# Patient Record
Sex: Female | Born: 1955 | Race: Black or African American | Hispanic: No | Marital: Married | Smoking: Never smoker
Health system: Southern US, Community
[De-identification: ages and names within clinical notes are randomized; demographics above are authoritative.]

## PROBLEM LIST (undated history)

## (undated) DIAGNOSIS — IMO0002 Reserved for concepts with insufficient information to code with codable children: Secondary | ICD-10-CM

## (undated) DIAGNOSIS — R112 Nausea with vomiting, unspecified: Secondary | ICD-10-CM

## (undated) DIAGNOSIS — Z789 Other specified health status: Secondary | ICD-10-CM

## (undated) DIAGNOSIS — Q796 Ehlers-Danlos syndrome, unspecified: Secondary | ICD-10-CM

## (undated) DIAGNOSIS — Z8709 Personal history of other diseases of the respiratory system: Secondary | ICD-10-CM

## (undated) DIAGNOSIS — I341 Nonrheumatic mitral (valve) prolapse: Secondary | ICD-10-CM

## (undated) DIAGNOSIS — M1990 Unspecified osteoarthritis, unspecified site: Secondary | ICD-10-CM

## (undated) DIAGNOSIS — Z9889 Other specified postprocedural states: Secondary | ICD-10-CM

## (undated) DIAGNOSIS — D649 Anemia, unspecified: Secondary | ICD-10-CM

## (undated) HISTORY — PX: DIAGNOSTIC LAPAROSCOPY: SUR761

## (undated) HISTORY — PX: ABDOMINAL HYSTERECTOMY: SHX81

## (undated) HISTORY — PX: DILATION AND CURETTAGE OF UTERUS: SHX78

## (undated) HISTORY — PX: TONSILLECTOMY: SUR1361

## (undated) HISTORY — PX: DENTAL RESTORATION/EXTRACTION WITH X-RAY: SHX5796

---

## 2012-08-09 ENCOUNTER — Encounter (HOSPITAL_COMMUNITY): Payer: Self-pay | Admitting: Pharmacy Technician

## 2012-08-17 ENCOUNTER — Other Ambulatory Visit: Payer: Self-pay | Admitting: Obstetrics & Gynecology

## 2012-08-17 ENCOUNTER — Other Ambulatory Visit: Payer: Self-pay

## 2012-08-17 ENCOUNTER — Encounter (HOSPITAL_COMMUNITY): Payer: Self-pay

## 2012-08-17 ENCOUNTER — Encounter (HOSPITAL_COMMUNITY)
Admission: RE | Admit: 2012-08-17 | Discharge: 2012-08-17 | Disposition: A | Discharge status: Home / Self Care | Payer: Medicaid Other | Source: Ambulatory Visit | Attending: Obstetrics & Gynecology | Admitting: Obstetrics & Gynecology

## 2012-08-17 HISTORY — DX: Other specified postprocedural states: R11.2

## 2012-08-17 HISTORY — DX: Reserved for concepts with insufficient information to code with codable children: IMO0002

## 2012-08-17 HISTORY — DX: Ehlers-Danlos syndrome, unspecified: Q79.60

## 2012-08-17 HISTORY — DX: Nonrheumatic mitral (valve) prolapse: I34.1

## 2012-08-17 HISTORY — DX: Unspecified osteoarthritis, unspecified site: M19.90

## 2012-08-17 HISTORY — DX: Other specified postprocedural states: Z98.890

## 2012-08-17 HISTORY — DX: Anemia, unspecified: D64.9

## 2012-08-17 LAB — COMPREHENSIVE METABOLIC PANEL
ALT: 11 U/L (ref 0–35)
Albumin: 4 g/dL (ref 3.5–5.2)
Alkaline Phosphatase: 91 U/L (ref 39–117)
Calcium: 10.5 mg/dL (ref 8.4–10.5)
GFR calc Af Amer: >90 mL/min (ref >90)
Potassium: 4.3 mEq/L (ref 3.5–5.1)
Sodium: 138 mEq/L (ref 135–145)
Total Protein: 7.6 g/dL (ref 6.0–8.3)

## 2012-08-17 LAB — URINALYSIS, ROUTINE W REFLEX MICROSCOPIC
Bilirubin Urine: NEGATIVE
Glucose, UA: 500 mg/dL — AB
Ketones, ur: NEGATIVE mg/dL
Nitrite: NEGATIVE
Specific Gravity, Urine: 1.025 (ref 1.005–1.030)
pH: 5.5 (ref 5.0–8.0)

## 2012-08-17 LAB — CBC
HCT: 43.9 % (ref 36.0–46.0)
Hemoglobin: 14.7 g/dL (ref 12.0–15.0)
MCH: 29.3 pg (ref 26.0–34.0)
MCHC: 33.5 g/dL (ref 30.0–36.0)
MCV: 87.6 fL (ref 78.0–100.0)
Platelets: 253 10*3/uL (ref 150–400)
RBC: 5.01 MIL/uL (ref 3.87–5.11)
RDW: 14.7 % (ref 11.5–15.5)
WBC: 8.6 10*3/uL (ref 4.0–10.5)

## 2012-08-17 LAB — SURGICAL PCR SCREEN
MRSA, PCR: NEGATIVE
Staphylococcus aureus: NEGATIVE

## 2012-08-17 LAB — URINE MICROSCOPIC-ADD ON

## 2012-08-17 NOTE — Pre-Procedure Instructions (Signed)
Patient has Ehlers-Danlos syndrome. Dr Jayme Cloud notified and no further orders given.

## 2012-08-17 NOTE — Patient Instructions (Addendum)
Hysterectomy Information  A hysterectomy is a procedure where your uterus is surgically removed. It will no longer be possible to have menstrual periods or to become pregnant. The tubes and ovaries can be removed (bilateral salpingo-oopherectomy) during this surgery as well.  REASONS FOR A HYSTERECTOMY  Persistent, abnormal bleeding.   Lasting (chronic) pelvic pain or infection.   The lining of the uterus (endometrium) starts growing outside the uterus (endometriosis).   The endometrium starts growing in the muscle of the uterus (adenomyosis).   The uterus falls down into the vagina (pelvic organ prolapse).   Symptomatic uterine fibroids.   Precancerous cells.   Cervical cancer or uterine cancer.  TYPES OF HYSTERECTOMIES  Supracervical hysterectomy. This type removes the top part of the uterus, but not the cervix.   Total hysterectomy. This type removes the uterus and cervix.   Radical hysterectomy. This type removes the uterus, cervix, and the fibrous tissue that holds the uterus in place in the pelvis (parametrium).  WAYS A HYSTERECTOMY CAN BE PERFORMED  Abdominal hysterectomy. A large surgical cut (incision) is made in the abdomen. The uterus is removed through this incision.   Vaginal hysterectomy. An incision is made in the vagina. The uterus is removed through this incision. There are no abdominal incisions.   Conventional laparoscopic hysterectomy. A thin, lighted tube with a camera (laparoscope) is inserted into 3 or 4 small incisions in the abdomen. The uterus is cut into small pieces. The small pieces are removed through the incisions, or they are removed through the vagina.   Laparoscopic assisted vaginal hysterectomy (LAVH). Three or four small incisions are made in the abdomen. Part of the surgery is performed laparoscopically and part vaginally. The uterus is removed through the vagina.   Robot-assisted laparoscopic hysterectomy. A laparoscope is inserted into 3  or 4 small incisions in the abdomen. A computer-controlled device is used to give the surgeon a 3D image. This allows for more precise movements of surgical instruments. The uterus is cut into small pieces and removed through the incisions or removed through the vagina.  RISKS OF HYSTERECTOMY   Bleeding and risk of blood transfusion. Tell your caregiver if you do not want to receive any blood products.   Blood clots in the legs or lung.   Infection.   Injury to surrounding organs.   Anesthesia problems or side effects.   Conversion to an abdominal hysterectomy.  WHAT TO EXPECT AFTER A HYSTERECTOMY  You will be given pain medicine.   You will need to have someone with you for the first 3 to 5 days after you go home.   You will need to follow up with your surgeon in 2 to 4 weeks after surgery to evaluate your progress.   You may have early menopause symptoms like hot flashes, night sweats, and insomnia.   If you had a hysterectomy for a problem that was not a cancer or a condition that could lead to cancer, then you no longer need Pap tests. However, even if you no longer need a Pap test, a regular exam is a good idea to make sure no other problems are starting.  Document Released: 05/05/2001 Document Revised: 10/29/2011 Document Reviewed: 06/20/2011 [facility] Patient Information 2012 [place], [place].PATIENT INSTRUCTIONS20 Christina Aguirre  08/17/2012   Your procedure is scheduled on:  08/24/2012 Report to [facility] at  615  AM.  Call this number if you have problems the morning of surgery: [phone]   Remember:   Do not  eat food:After Midnight.  May have clear liquids:until Midnight .    Take these medicines the morning of surgery with A SIP OF WATER: zyrtec. Take flonase before you come.   Do not wear jewelry, make-up or nail polish.  Do not wear lotions, powders, or perfumes. You may wear deodorant.  Do not shave 48 hours prior to surgery. Men may shave face and neck.  Do not  bring valuables to the hospital.  Contacts, dentures or bridgework may not be worn into surgery.  Leave suitcase in the car. After surgery it may be brought to your room.  For patients admitted to the hospital, checkout time is 11:00 AM the day of discharge.   Patients discharged the day of surgery will not be allowed to drive home.  Name and phone number of your driver: family  Special Instructions: Shower using CHG 2 nights before surgery and the night before surgery.  If you shower the day of surgery use CHG.  Use special wash - you have one bottle of CHG for all showers.  You should use approximately 1/3 of the bottle for each shower.   Please read over the following fact sheets that you were given: Pain Booklet, MRSA Information, Surgical Site Infection Prevention, Anesthesia Post-op Instructions and Care and Recovery After Surgery    POST-ANESTHESIA  IMMEDIATELY FOLLOWING SURGERY:  Do not drive or operate machinery for the first twenty four hours after surgery.  Do not make any important decisions for twenty four hours after surgery or while taking narcotic pain medications or sedatives.  If you develop intractable nausea and vomiting or a severe headache please notify your doctor immediately.  FOLLOW-UP:  Please make an appointment with your surgeon as instructed. You do not need to follow up with anesthesia unless specifically instructed to do so.  WOUND CARE INSTRUCTIONS (if applicable):  Keep a dry clean dressing on the anesthesia/puncture wound site if there is drainage.  Once the wound has quit draining you may leave it open to air.  Generally you should leave the bandage intact for twenty four hours unless there is drainage.  If the epidural site drains for more than 36-48 hours please call the anesthesia department.  QUESTIONS?:  Please feel free to call your physician or the hospital operator if you have any questions, and they will be happy to assist you.

## 2012-08-22 ENCOUNTER — Ambulatory Visit (HOSPITAL_COMMUNITY)
Admission: RE | Admit: 2012-08-22 | Discharge: 2012-08-22 | Disposition: A | Discharge status: Home / Self Care | Payer: Medicaid Other | Source: Ambulatory Visit

## 2012-08-22 LAB — TYPE AND SCREEN: ABO/RH(D): A NEG

## 2012-08-24 ENCOUNTER — Encounter (HOSPITAL_COMMUNITY): Payer: Self-pay | Admitting: Anesthesiology

## 2012-08-24 ENCOUNTER — Encounter (HOSPITAL_COMMUNITY): Payer: Self-pay | Admitting: *Deleted

## 2012-08-24 ENCOUNTER — Encounter (HOSPITAL_COMMUNITY)
Admission: RE | Disposition: A | Discharge status: Home / Self Care | Payer: Self-pay | Source: Ambulatory Visit | Attending: Obstetrics & Gynecology

## 2012-08-24 ENCOUNTER — Ambulatory Visit (HOSPITAL_COMMUNITY)
Admission: RE | Admit: 2012-08-24 | Discharge: 2012-08-27 | Disposition: A | Discharge status: Home / Self Care | Payer: Medicaid Other | Source: Ambulatory Visit | Attending: Obstetrics & Gynecology | Admitting: Obstetrics & Gynecology

## 2012-08-24 ENCOUNTER — Ambulatory Visit (HOSPITAL_COMMUNITY): Payer: Medicaid Other | Admitting: Anesthesiology

## 2012-08-24 DIAGNOSIS — Z01812 Encounter for preprocedural laboratory examination: Secondary | ICD-10-CM | POA: Insufficient documentation

## 2012-08-24 DIAGNOSIS — Z01810 Encounter for preprocedural cardiovascular examination: Secondary | ICD-10-CM | POA: Insufficient documentation

## 2012-08-24 DIAGNOSIS — E119 Type 2 diabetes mellitus without complications: Secondary | ICD-10-CM | POA: Insufficient documentation

## 2012-08-24 DIAGNOSIS — Q796 Ehlers-Danlos syndrome, unspecified: Secondary | ICD-10-CM | POA: Insufficient documentation

## 2012-08-24 DIAGNOSIS — N8110 Cystocele, unspecified: Secondary | ICD-10-CM

## 2012-08-24 DIAGNOSIS — N812 Incomplete uterovaginal prolapse: Principal | ICD-10-CM | POA: Insufficient documentation

## 2012-08-24 HISTORY — PX: CYSTOCELE REPAIR: SHX163

## 2012-08-24 HISTORY — PX: VAGINAL PROLAPSE REPAIR: SHX830

## 2012-08-24 HISTORY — PX: PUBOVAGINAL SLING: SHX1035

## 2012-08-24 HISTORY — PX: VAGINAL HYSTERECTOMY: SHX2639

## 2012-08-24 HISTORY — PX: CYSTOSCOPY: SHX5120

## 2012-08-24 LAB — GLUCOSE, CAPILLARY: Glucose-Capillary: 183 mg/dL — ABNORMAL HIGH (ref 70–99)

## 2012-08-24 SURGERY — HYSTERECTOMY, VAGINAL
Anesthesia: General | Site: Vagina | Wound class: Clean Contaminated

## 2012-08-24 MED ORDER — DROPERIDOL 2.5 MG/ML IJ SOLN
INTRAMUSCULAR | Status: AC
  Filled 2012-08-24: qty 2

## 2012-08-24 MED ORDER — IBUPROFEN 800 MG PO TABS: 800.0000 mg | ORAL_TABLET | Freq: Three times a day (TID) | ORAL | Status: DC | PRN

## 2012-08-24 MED ORDER — DOCUSATE SODIUM 100 MG PO CAPS
100.0000 mg | ORAL_CAPSULE | Freq: Two times a day (BID) | ORAL | Status: DC
  Administered 2012-08-24 – 2012-08-27 (×7): 100 mg via ORAL
  Filled 2012-08-24 (×7): qty 1

## 2012-08-24 MED ORDER — MENTHOL 3 MG MT LOZG
1.0000 | LOZENGE | OROMUCOSAL | Status: DC | PRN
  Filled 2012-08-24: qty 9

## 2012-08-24 MED ORDER — NEOSTIGMINE METHYLSULFATE 1 MG/ML IJ SOLN
INTRAMUSCULAR | Status: AC
  Filled 2012-08-24: qty 10

## 2012-08-24 MED ORDER — FENTANYL CITRATE 0.05 MG/ML IJ SOLN
INTRAMUSCULAR | Status: AC
  Filled 2012-08-24: qty 5

## 2012-08-24 MED ORDER — GLIMEPIRIDE 2 MG PO TABS
1.0000 mg | ORAL_TABLET | Freq: Every day | ORAL | Status: DC
  Administered 2012-08-25 – 2012-08-27 (×3): 1 mg via ORAL
  Filled 2012-08-24 (×3): qty 1

## 2012-08-24 MED ORDER — ONDANSETRON HCL 4 MG/2ML IJ SOLN
4.0000 mg | Freq: Once | INTRAMUSCULAR | Status: AC
Start: 2012-08-24 — End: 2012-08-24
  Administered 2012-08-24: 4 mg via INTRAVENOUS

## 2012-08-24 MED ORDER — SIMETHICONE 80 MG PO CHEW: 80.0000 mg | CHEWABLE_TABLET | Freq: Four times a day (QID) | ORAL | Status: DC | PRN

## 2012-08-24 MED ORDER — LORATADINE 10 MG PO TABS
10.0000 mg | ORAL_TABLET | Freq: Every day | ORAL | Status: DC
  Administered 2012-08-25 – 2012-08-27 (×3): 10 mg via ORAL
  Filled 2012-08-24 (×3): qty 1

## 2012-08-24 MED ORDER — ONDANSETRON HCL 4 MG/2ML IJ SOLN: 4.0000 mg | Freq: Once | INTRAMUSCULAR | Status: DC | PRN

## 2012-08-24 MED ORDER — KETOROLAC TROMETHAMINE 30 MG/ML IJ SOLN
30.0000 mg | Freq: Once | INTRAMUSCULAR | Status: AC
  Administered 2012-08-24: 30 mg via INTRAVENOUS

## 2012-08-24 MED ORDER — FENTANYL CITRATE 0.05 MG/ML IJ SOLN
25.0000 ug | INTRAMUSCULAR | Status: DC | PRN
  Administered 2012-08-24 (×4): 50 ug via INTRAVENOUS

## 2012-08-24 MED ORDER — PROPOFOL 10 MG/ML IV BOLUS
INTRAVENOUS | Status: DC | PRN
  Administered 2012-08-24: 130 mg via INTRAVENOUS

## 2012-08-24 MED ORDER — GLYCOPYRROLATE 0.2 MG/ML IJ SOLN
INTRAMUSCULAR | Status: DC | PRN
  Administered 2012-08-24: .6 mg via INTRAVENOUS

## 2012-08-24 MED ORDER — LIDOCAINE HCL (PF) 1 % IJ SOLN
INTRAMUSCULAR | Status: AC
  Filled 2012-08-24: qty 5

## 2012-08-24 MED ORDER — FENTANYL CITRATE 0.05 MG/ML IJ SOLN
INTRAMUSCULAR | Status: AC
  Filled 2012-08-24: qty 2

## 2012-08-24 MED ORDER — OXYCODONE-ACETAMINOPHEN 5-325 MG PO TABS
1.0000 | ORAL_TABLET | ORAL | Status: DC | PRN
  Administered 2012-08-25 – 2012-08-27 (×9): 2 via ORAL
  Filled 2012-08-24: qty 8
  Filled 2012-08-24: qty 10
  Filled 2012-08-24 (×3): qty 2
  Filled 2012-08-24: qty 8
  Filled 2012-08-24: qty 2
  Filled 2012-08-24: qty 8
  Filled 2012-08-24 (×5): qty 2

## 2012-08-24 MED ORDER — BUPIVACAINE-EPINEPHRINE PF 0.5-1:200000 % IJ SOLN
INTRAMUSCULAR | Status: AC
  Filled 2012-08-24: qty 20

## 2012-08-24 MED ORDER — GLYCOPYRROLATE 0.2 MG/ML IJ SOLN
INTRAMUSCULAR | Status: AC
  Filled 2012-08-24: qty 1

## 2012-08-24 MED ORDER — DEXAMETHASONE SODIUM PHOSPHATE 4 MG/ML IJ SOLN
4.0000 mg | Freq: Once | INTRAMUSCULAR | Status: AC
  Administered 2012-08-24: 4 mg via INTRAVENOUS

## 2012-08-24 MED ORDER — STERILE WATER FOR IRRIGATION IR SOLN
Status: DC | PRN
  Administered 2012-08-24: 1000 mL

## 2012-08-24 MED ORDER — ZOLPIDEM TARTRATE 5 MG PO TABS: 10.0000 mg | ORAL_TABLET | Freq: Every evening | ORAL | Status: DC | PRN

## 2012-08-24 MED ORDER — NEOSTIGMINE METHYLSULFATE 1 MG/ML IJ SOLN
INTRAMUSCULAR | Status: DC | PRN
  Administered 2012-08-24: 3 mg via INTRAVENOUS

## 2012-08-24 MED ORDER — BUPIVACAINE-EPINEPHRINE 0.5% -1:200000 IJ SOLN
INTRAMUSCULAR | Status: DC | PRN
  Administered 2012-08-24: 28 mL

## 2012-08-24 MED ORDER — PROPOFOL 10 MG/ML IV EMUL
INTRAVENOUS | Status: AC
  Filled 2012-08-24: qty 20

## 2012-08-24 MED ORDER — SCOPOLAMINE 1 MG/3DAYS TD PT72
MEDICATED_PATCH | TRANSDERMAL | Status: AC
  Filled 2012-08-24: qty 1

## 2012-08-24 MED ORDER — KETOROLAC TROMETHAMINE 30 MG/ML IJ SOLN
INTRAMUSCULAR | Status: AC
  Filled 2012-08-24: qty 1

## 2012-08-24 MED ORDER — KCL IN DEXTROSE-NACL 20-5-0.45 MEQ/L-%-% IV SOLN
INTRAVENOUS | Status: DC
  Administered 2012-08-24 – 2012-08-25 (×4): via INTRAVENOUS
  Administered 2012-08-26: 1000 mL via INTRAVENOUS

## 2012-08-24 MED ORDER — GLYCOPYRROLATE 0.2 MG/ML IJ SOLN
0.2000 mg | Freq: Once | INTRAMUSCULAR | Status: AC
  Administered 2012-08-24: 0.2 mg via INTRAVENOUS

## 2012-08-24 MED ORDER — LACTATED RINGERS IV SOLN
INTRAVENOUS | Status: DC
  Administered 2012-08-24 (×3): via INTRAVENOUS

## 2012-08-24 MED ORDER — CLINDAMYCIN PHOSPHATE 900 MG/50ML IV SOLN
INTRAVENOUS | Status: AC
  Filled 2012-08-24: qty 50

## 2012-08-24 MED ORDER — LIDOCAINE HCL 1 % IJ SOLN
INTRAMUSCULAR | Status: DC | PRN
  Administered 2012-08-24: 30 mg via INTRADERMAL

## 2012-08-24 MED ORDER — HYDROMORPHONE HCL PF 1 MG/ML IJ SOLN
1.0000 mg | INTRAMUSCULAR | Status: DC | PRN
  Administered 2012-08-24 (×4): 1 mg via INTRAVENOUS
  Administered 2012-08-25 (×2): 2 mg via INTRAVENOUS
  Administered 2012-08-25 (×2): 1 mg via INTRAVENOUS
  Administered 2012-08-25: 2 mg via INTRAVENOUS
  Administered 2012-08-25: 1 mg via INTRAVENOUS
  Administered 2012-08-25: 2 mg via INTRAVENOUS
  Administered 2012-08-25: 1 mg via INTRAVENOUS
  Filled 2012-08-24: qty 2
  Filled 2012-08-24: qty 1
  Filled 2012-08-24: qty 5
  Filled 2012-08-24: qty 1
  Filled 2012-08-24: qty 10
  Filled 2012-08-24: qty 1
  Filled 2012-08-24: qty 7
  Filled 2012-08-24: qty 1
  Filled 2012-08-24: qty 6
  Filled 2012-08-24: qty 5
  Filled 2012-08-24: qty 2
  Filled 2012-08-24 (×2): qty 1
  Filled 2012-08-24: qty 7
  Filled 2012-08-24 (×2): qty 1

## 2012-08-24 MED ORDER — DEXAMETHASONE SODIUM PHOSPHATE 4 MG/ML IJ SOLN
INTRAMUSCULAR | Status: AC
  Filled 2012-08-24: qty 1

## 2012-08-24 MED ORDER — GLYCOPYRROLATE 0.2 MG/ML IJ SOLN
INTRAMUSCULAR | Status: AC
  Filled 2012-08-24: qty 3

## 2012-08-24 MED ORDER — ONDANSETRON 8 MG/NS 50 ML IVPB
8.0000 mg | Freq: Four times a day (QID) | INTRAVENOUS | Status: DC | PRN
  Administered 2012-08-24: 8 mg via INTRAVENOUS
  Filled 2012-08-24 (×3): qty 8

## 2012-08-24 MED ORDER — FLUTICASONE PROPIONATE 50 MCG/ACT NA SUSP
2.0000 | Freq: Every day | NASAL | Status: DC
  Administered 2012-08-26 – 2012-08-27 (×2): 2 via NASAL
  Filled 2012-08-24: qty 16

## 2012-08-24 MED ORDER — MIDAZOLAM HCL 2 MG/2ML IJ SOLN
1.0000 mg | INTRAMUSCULAR | Status: DC | PRN
  Administered 2012-08-24: 2 mg via INTRAVENOUS

## 2012-08-24 MED ORDER — SODIUM CHLORIDE 0.9 % IR SOLN
Status: DC | PRN
  Administered 2012-08-24: 1000 mL

## 2012-08-24 MED ORDER — SCOPOLAMINE 1 MG/3DAYS TD PT72
1.0000 | MEDICATED_PATCH | Freq: Once | TRANSDERMAL | Status: DC
  Administered 2012-08-24: 1.5 mg via TRANSDERMAL

## 2012-08-24 MED ORDER — CIPROFLOXACIN HCL 250 MG PO TABS
500.0000 mg | ORAL_TABLET | Freq: Two times a day (BID) | ORAL | Status: DC
  Administered 2012-08-24 – 2012-08-27 (×6): 500 mg via ORAL
  Filled 2012-08-24 (×6): qty 2

## 2012-08-24 MED ORDER — MIDAZOLAM HCL 2 MG/2ML IJ SOLN
INTRAMUSCULAR | Status: AC
  Filled 2012-08-24: qty 2

## 2012-08-24 MED ORDER — FENTANYL CITRATE 0.05 MG/ML IJ SOLN
INTRAMUSCULAR | Status: DC | PRN
  Administered 2012-08-24 (×7): 50 ug via INTRAVENOUS

## 2012-08-24 MED ORDER — CIPROFLOXACIN IN D5W 400 MG/200ML IV SOLN
INTRAVENOUS | Status: AC
  Filled 2012-08-24: qty 200

## 2012-08-24 MED ORDER — SODIUM CHLORIDE 0.9 % IR SOLN
Status: DC | PRN
  Administered 2012-08-24: 3000 mL

## 2012-08-24 MED ORDER — ROCURONIUM BROMIDE 100 MG/10ML IV SOLN
INTRAVENOUS | Status: DC | PRN
  Administered 2012-08-24 (×3): 10 mg via INTRAVENOUS
  Administered 2012-08-24: 35 mg via INTRAVENOUS
  Administered 2012-08-24: 5 mg via INTRAVENOUS
  Administered 2012-08-24: 15 mg via INTRAVENOUS
  Administered 2012-08-24 (×2): 5 mg via INTRAVENOUS

## 2012-08-24 MED ORDER — ONDANSETRON HCL 4 MG/2ML IJ SOLN
INTRAMUSCULAR | Status: AC
  Filled 2012-08-24: qty 2

## 2012-08-24 MED ORDER — ROCURONIUM BROMIDE 50 MG/5ML IV SOLN
INTRAVENOUS | Status: AC
  Filled 2012-08-24: qty 1

## 2012-08-24 MED ORDER — METFORMIN HCL ER 500 MG PO TB24
500.0000 mg | ORAL_TABLET | Freq: Every day | ORAL | Status: DC
Start: 2012-08-25 — End: 2012-08-27
  Administered 2012-08-25 – 2012-08-27 (×3): 500 mg via ORAL
  Filled 2012-08-24 (×5): qty 1

## 2012-08-24 MED ORDER — CIPROFLOXACIN IN D5W 400 MG/200ML IV SOLN
400.0000 mg | INTRAVENOUS | Status: AC
  Administered 2012-08-24: 400 mg via INTRAVENOUS

## 2012-08-24 MED ORDER — ONDANSETRON HCL 4 MG PO TABS
8.0000 mg | ORAL_TABLET | Freq: Four times a day (QID) | ORAL | Status: DC | PRN
  Administered 2012-08-26 – 2012-08-27 (×2): 8 mg via ORAL
  Filled 2012-08-24 (×3): qty 2

## 2012-08-24 MED ORDER — CLINDAMYCIN PHOSPHATE 900 MG/50ML IV SOLN
900.0000 mg | INTRAVENOUS | Status: AC
  Administered 2012-08-24: 900 mg via INTRAVENOUS

## 2012-08-24 MED ORDER — DROPERIDOL 2.5 MG/ML IJ SOLN
INTRAMUSCULAR | Status: DC | PRN
  Administered 2012-08-24: 0.625 mg via INTRAVENOUS

## 2012-08-24 SURGICAL SUPPLY — 78 items
APPLIER CLIP 11 MED OPEN (CLIP)
APPLIER CLIP 13 LRG OPEN (CLIP) ×4
BAG HAMPER (MISCELLANEOUS) ×4 IMPLANT
BLADE SURG ROTATE 9660 (MISCELLANEOUS) ×4 IMPLANT
CATH FOLEY LATEX FREE 14FR (CATHETERS) ×1
CATH FOLEY LF 14FR (CATHETERS) ×3 IMPLANT
CELLS DAT CNTRL 66122 CELL SVR (MISCELLANEOUS) IMPLANT
CLIP APPLIE 11 MED OPEN (CLIP) IMPLANT
CLIP APPLIE 13 LRG OPEN (CLIP) ×3 IMPLANT
CLOTH BEACON ORANGE TIMEOUT ST (SAFETY) ×4 IMPLANT
COVER LIGHT HANDLE STERIS (MISCELLANEOUS) ×8 IMPLANT
COVER MAYO STAND XLG (DRAPE) ×4 IMPLANT
DECANTER SPIKE VIAL GLASS SM (MISCELLANEOUS) ×4 IMPLANT
DERMABOND ADVANCED (GAUZE/BANDAGES/DRESSINGS)
DERMABOND ADVANCED .7 DNX12 (GAUZE/BANDAGES/DRESSINGS) IMPLANT
DEVICE CAPIO SLIM SINGLE (INSTRUMENTS) ×4 IMPLANT
DEVICE CAPIO SUTURING (INSTRUMENTS)
DEVICE CAPIO SUTURING OPC (INSTRUMENTS) IMPLANT
DRAPE PROXIMA HALF (DRAPES) ×4 IMPLANT
DRAPE STERI URO 9X17 APER PCH (DRAPES) ×4 IMPLANT
DRAPE WARM FLUID 44X44 (DRAPE) IMPLANT
DRESSING TELFA 8X3 (GAUZE/BANDAGES/DRESSINGS) ×4 IMPLANT
ELECT REM PT RETURN 9FT ADLT (ELECTROSURGICAL) ×4
ELECTRODE REM PT RTRN 9FT ADLT (ELECTROSURGICAL) ×3 IMPLANT
FORMALIN 10 PREFIL 480ML (MISCELLANEOUS) IMPLANT
GAUZE PACKING 2X5 YD STERILE (GAUZE/BANDAGES/DRESSINGS) ×4 IMPLANT
GLOVE BIOGEL PI IND STRL 8 (GLOVE) IMPLANT
GLOVE BIOGEL PI INDICATOR 8 (GLOVE)
GLOVE ECLIPSE 8.0 STRL XLNG CF (GLOVE) ×4 IMPLANT
GLOVE EXAM NITRILE MD LF STRL (GLOVE) ×4 IMPLANT
GLOVE INDICATOR 7.0 STRL GRN (GLOVE) ×16 IMPLANT
GLOVE SKINSENSE NS SZ6.5 (GLOVE) ×1
GLOVE SKINSENSE STRL SZ6.5 (GLOVE) ×3 IMPLANT
GLOVE SS N UNI LF 6.5 STRL (GLOVE) ×8 IMPLANT
GLOVE SS N UNI LF 8.0 STRL (GLOVE) ×4 IMPLANT
GLOVE SS N UNI LF STER SZ 9 (GLOVE) ×4 IMPLANT
GOWN STRL REIN XL XLG (GOWN DISPOSABLE) ×16 IMPLANT
INST SET MAJOR GENERAL (KITS) ×4 IMPLANT
IV NS IRRIG 3000ML ARTHROMATIC (IV SOLUTION) ×4 IMPLANT
KIT BLADEGUARD II DBL (SET/KITS/TRAYS/PACK) ×4 IMPLANT
KIT ROOM TURNOVER AP CYSTO (KITS) ×4 IMPLANT
KIT ROOM TURNOVER APOR (KITS) ×4 IMPLANT
MANIFOLD NEPTUNE II (INSTRUMENTS) ×4 IMPLANT
NEEDLE HYPO 18GX1.5 BLUNT FILL (NEEDLE) ×4 IMPLANT
NEEDLE HYPO 22GX1.5 SAFETY (NEEDLE) ×4 IMPLANT
NEEDLE HYPO 25X1 1.5 SAFETY (NEEDLE) ×4 IMPLANT
NS IRRIG 1000ML POUR BTL (IV SOLUTION) ×4 IMPLANT
PACK ABDOMINAL MAJOR (CUSTOM PROCEDURE TRAY) ×4 IMPLANT
PACK PERI GYN (CUSTOM PROCEDURE TRAY) ×4 IMPLANT
PAD ARMBOARD 7.5X6 YLW CONV (MISCELLANEOUS) ×4 IMPLANT
Pelvilace ×4 IMPLANT
RETRACTOR WND ALEXIS 25 LRG (MISCELLANEOUS) IMPLANT
RTRCTR WOUND ALEXIS 18CM MED (MISCELLANEOUS)
RTRCTR WOUND ALEXIS 25CM LRG (MISCELLANEOUS)
SEPRAFILM MEMBRANE 5X6 (MISCELLANEOUS) IMPLANT
SET BASIN LINEN APH (SET/KITS/TRAYS/PACK) ×4 IMPLANT
SET IRRIG Y TYPE TUR BLADDER L (SET/KITS/TRAYS/PACK) ×4 IMPLANT
SET IV ADMIN VERSALIGHT (MISCELLANEOUS) ×4 IMPLANT
SPONGE GAUZE 2X2 8PLY STRL LF (GAUZE/BANDAGES/DRESSINGS) ×8 IMPLANT
STAPLER VISISTAT 35W (STAPLE) ×4 IMPLANT
SUT CAPIO POLYGLYCOLIC (SUTURE) IMPLANT
SUT CHROMIC 0 CT 1 (SUTURE) ×4 IMPLANT
SUT MNCRL+ AB 3-0 CT1 36 (SUTURE) IMPLANT
SUT MON AB 3-0 SH 27 (SUTURE) ×4 IMPLANT
SUT MONOCRYL AB 3-0 CT1 36IN (SUTURE)
SUT VIC AB 0 CT1 27 (SUTURE) ×3
SUT VIC AB 0 CT1 27XBRD ANTBC (SUTURE) IMPLANT
SUT VIC AB 0 CT1 27XCR 8 STRN (SUTURE) ×9 IMPLANT
SUT VIC AB 0 CT2 8-18 (SUTURE) ×8 IMPLANT
SUT VIC AB 0 CTX 36 (SUTURE)
SUT VIC AB 0 CTX36XBRD ANTBCTR (SUTURE) IMPLANT
SUT VICRYL 3 0 (SUTURE) IMPLANT
SYR CONTROL 10ML LL (SYRINGE) ×4 IMPLANT
TAPE CLOTH SURG 4X10 WHT LF (GAUZE/BANDAGES/DRESSINGS) ×4 IMPLANT
TOWEL OR 17X26 4PK STRL BLUE (TOWEL DISPOSABLE) ×4 IMPLANT
TRAY FOLEY CATH 14FR (SET/KITS/TRAYS/PACK) IMPLANT
VERSALIGHT (MISCELLANEOUS) IMPLANT
Xenform 6cmx10cm Soft Tissue Repair Matrix ×4 IMPLANT

## 2012-08-24 NOTE — Anesthesia Procedure Notes (Signed)
Procedure Name: Intubation Date/Time: 08/24/2012 7:54 AM Performed by: Glynn Octave E Pre-anesthesia Checklist: Patient identified, Patient being monitored, Timeout performed, Emergency Drugs available and Suction available Patient Re-evaluated:Patient Re-evaluated prior to inductionOxygen Delivery Method: Circle System Utilized Preoxygenation: Pre-oxygenation with 100% oxygen Intubation Type: IV induction Ventilation: Mask ventilation without difficulty Laryngoscope Size: Mac and 3 Grade View: Grade I Tube type: Oral Tube size: 7.0 mm Number of attempts: 1 Airway Equipment and Method: stylet Placement Confirmation: ETT inserted through vocal cords under direct vision,  positive ETCO2 and breath sounds checked- equal and bilateral Secured at: 21 cm Tube secured with: Tape Dental Injury: Teeth and Oropharynx as per pre-operative assessment

## 2012-08-24 NOTE — H&P (Signed)
Christina Aguirre is an 56 y.o. 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  She has delayed repair  Until her symptoms have become unbearable.  I actually placed a large Milex ring with support about 5 weeks ago to maintain some degree of support awaiting surgery.  She does have some posterior compartment relaxation but will opt to not repair at this time.  Notably the patient had Ehlers-Danlos which is a big contributor to her prolapse.  Despite significant urethral prolapse she does not have incontinence because i believe of the kink in her urethra as a result of her prolapse.     Past Medical History  Diagnosis Date  . Diabetes mellitus   . PONV (postoperative nausea and vomiting)   . Arthritis   . Anemia   . Degenerative disc disease   . Mitral valve prolapse   . Ehler's-Danlos syndrome     Past Surgical History  Procedure Date  . Dilation and curettage of uterus     miscarriages  . Tonsillectomy   . Diagnostic laparoscopy     No family history on file.  Social History:  reports that she has never smoked. She does not have any smokeless tobacco history on file. She reports that she drinks about .6 ounces of alcohol per week. She reports that she does not use illicit drugs.  Allergies:  Allergies  Allergen Reactions  . Latex Hives  . Penicillins     Respiratory distress    . Vancomycin Hives and Itching  . Doxycycline Swelling and Rash    Prescriptions prior to admission  Medication Sig Dispense Refill  . cetirizine (ZYRTEC) 10 MG tablet Take 10 mg by mouth daily.      . fluticasone (FLONASE) 50 MCG/ACT nasal spray Place 2 sprays into the nose daily.      Marland Kitchen glimepiride (AMARYL) 1 MG tablet Take 1 mg by mouth daily before breakfast.      . ibuprofen (ADVIL,MOTRIN) 200 MG tablet Take 400 mg by mouth every 6 (six) hours as needed. For pain      . metFORMIN (GLUCOPHAGE-XR)  500 MG 24 hr tablet Take 500 mg by mouth daily with breakfast.        ROS  Review of Systems  Constitutional: Negative for fever, chills, weight loss, malaise/fatigue and diaphoresis.  HENT: Negative for hearing loss, ear pain, nosebleeds, congestion, sore throat, neck pain, tinnitus and ear discharge.   Eyes: Negative for blurred vision, double vision, photophobia, pain, discharge and redness.  Respiratory: Negative for cough, hemoptysis, sputum production, shortness of breath, wheezing and stridor.   Cardiovascular: Negative for chest pain, palpitations, orthopnea, claudication, leg swelling and PND.  Gastrointestinal: Negative for abdominal pain. Negative for heartburn, nausea, vomiting, diarrhea, constipation, blood in stool and melena.  Genitourinary: Negative for dysuria, urgency, frequency, hematuria and flank pain. Positive for severe pressure and relaxation symptoms Musculoskeletal: Negative for myalgias, back pain, joint pain and falls.  Skin: Negative for itching and rash.  Neurological: Negative for dizziness, tingling, tremors, sensory change, speech change, focal weakness, seizures, loss of consciousness, weakness and headaches.  Endo/Heme/Allergies: Negative for environmental allergies and polydipsia. Does not bruise/bleed easily.  Psychiatric/Behavioral: Negative for depression, suicidal ideas, hallucinations, memory loss and substance abuse. The patient is not nervous/anxious and does not have insomnia.      Blood pressure 135/85, pulse 74, temperature 98 F (36.7 C), temperature  source Oral, resp. rate 18, SpO2 97.00%. Physical Exam Physical Exam  Vitals reviewed. Constitutional: She is oriented to person, place, and time. She appears well-developed and well-nourished.  HENT:  Head: Normocephalic and atraumatic.  Right Ear: External ear normal.  Left Ear: External ear normal.  Nose: Nose normal.  Mouth/Throat: Oropharynx is clear and moist.  Eyes: Conjunctivae and  EOM are normal. Pupils are equal, round, and reactive to light. Right eye exhibits no discharge. Left eye exhibits no discharge. No scleral icterus.  Neck: Normal range of motion. Neck supple. No tracheal deviation present. No thyromegaly present.  Cardiovascular: Normal rate, regular rhythm, normal heart sounds and intact distal pulses.  Exam reveals no gallop and no friction rub.   No murmur heard. Respiratory: Effort normal and breath sounds normal. No respiratory distress. She has no wheezes. She has no rales. She exhibits no tenderness.  GI: Soft. Bowel sounds are normal. She exhibits no distension and no mass. There is tenderness. There is no rebound and no guarding.  Genitourinary:       Vulva is normal without lesions Vagina is pink moist without discharge Grade IV anterior prolapse Cervix normal in appearance and pap is normal Uterus is normal with Grade III prolapse Adnexa is negative with normal sized ovaries by sonogram  Musculoskeletal: Normal range of motion. She exhibits no edema and no tenderness.  Neurological: She is alert and oriented to person, place, and time. She has normal reflexes. She displays normal reflexes. No cranial nerve deficit. She exhibits normal muscle tone. Coordination normal.  Skin: Skin is warm and dry. No rash noted. No erythema. No pallor.  Psychiatric: She has a normal mood and affect. Her behavior is normal. Judgment and thought content normal.     Recent Results (from the past 336 hour(s))  URINALYSIS, ROUTINE W REFLEX MICROSCOPIC   Collection Time   08/17/12 10:05 AM      Component Value Range   Color, Urine YELLOW  YELLOW   APPearance CLEAR  CLEAR   Specific Gravity, Urine 1.025  1.005 - 1.030   pH 5.5  5.0 - 8.0   Glucose, UA 500 (*) NEGATIVE mg/dL   Hgb urine dipstick NEGATIVE  NEGATIVE   Bilirubin Urine NEGATIVE  NEGATIVE   Ketones, ur NEGATIVE  NEGATIVE mg/dL   Protein, ur NEGATIVE  NEGATIVE mg/dL   Urobilinogen, UA 0.2  0.0 - 1.0  mg/dL   Nitrite NEGATIVE  NEGATIVE   Leukocytes, UA TRACE (*) NEGATIVE  URINE MICROSCOPIC-ADD ON   Collection Time   08/17/12 10:05 AM      Component Value Range   Squamous Epithelial / LPF FEW (*) RARE   WBC, UA 0-2  <3 WBC/hpf   Bacteria, UA FEW (*) RARE  SURGICAL PCR SCREEN   Collection Time   08/17/12 10:25 AM      Component Value Range   MRSA, PCR NEGATIVE  NEGATIVE   Staphylococcus aureus NEGATIVE  NEGATIVE  CBC   Collection Time   08/17/12 10:25 AM      Component Value Range   WBC 8.6  4.0 - 10.5 K/uL   RBC 5.01  3.87 - 5.11 MIL/uL   Hemoglobin 14.7  12.0 - 15.0 g/dL   HCT 16.1  09.6 - 04.5 %   MCV 87.6  78.0 - 100.0 fL   MCH 29.3  26.0 - 34.0 pg   MCHC 33.5  30.0 - 36.0 g/dL   RDW 40.9  81.1 - 91.4 %   Platelets  253  150 - 400 K/uL  COMPREHENSIVE METABOLIC PANEL   Collection Time   08/17/12 10:25 AM      Component Value Range   Sodium 138  135 - 145 mEq/L   Potassium 4.3  3.5 - 5.1 mEq/L   Chloride 99  96 - 112 mEq/L   CO2 30  19 - 32 mEq/L   Glucose, Bld 190 (*) 70 - 99 mg/dL   BUN 11  6 - 23 mg/dL   Creatinine, Ser 1.61  0.50 - 1.10 mg/dL   Calcium 09.6  8.4 - 04.5 mg/dL   Total Protein 7.6  6.0 - 8.3 g/dL   Albumin 4.0  3.5 - 5.2 g/dL   AST 11  0 - 37 U/L   ALT 11  0 - 35 U/L   Alkaline Phosphatase 91  39 - 117 U/L   Total Bilirubin 0.3  0.3 - 1.2 mg/dL   GFR calc non Af Amer >90  >90 mL/min   GFR calc Af Amer >90  >90 mL/min  TYPE AND SCREEN   Collection Time   08/22/12  9:53 AM      Component Value Range   ABO/RH(D) A NEG     Antibody Screen NEG     Sample Expiration 08/25/2012         Assessment/Plan: 1.  Grade IV anterior prolapse 2.  Grade III uterine prolapse  Patient is admitted for [facility], anterior pelvic reconstruction with an anterior graft, retropubic mid urethral sling and vaginal vault suspension. Pt understands the risks of surgery including but not limited t  excessive bleeding requiring transfusion or reoperation, post-operative  infection requiring prolonged hospitalization or re-hospitalization and antibiotic therapy, and damage to other organs including bladder, bowel, ureters and major vessels.  The patient also understands the alternative treatment options which were discussed in full.  All questions were answered.   Terel Bann H 08/24/2012, 7:24 AM

## 2012-08-24 NOTE — Anesthesia Postprocedure Evaluation (Addendum)
  Anesthesia Post-op Note  Patient: Christina Aguirre  Procedure(s) Performed: Procedure(s) (LRB) with comments: HYSTERECTOMY VAGINAL (N/A) ANTERIOR REPAIR (CYSTOCELE) (N/A) - ANTERIOR REPAIR WITH GRAFT VAGINAL VAULT SUSPENSION (N/A) PUBO-VAGINAL SLING (N/A) CYSTOSCOPY (N/A)  Patient Location: PACU  Anesthesia Type: General  Level of Consciousness: sedated  Airway and Oxygen Therapy: Patient Spontanous Breathing and Patient connected to face mask oxygen  Post-op Pain: none  Post-op Assessment: Post-op Vital signs reviewed, Patient's Cardiovascular Status Stable, Respiratory Function Stable, Patent Airway and No signs of Nausea or vomiting  Post-op Vital Signs: Reviewed and stable  Complications: No apparent anesthesia complications 08/25/12  [name] has no recall of anything until in her room.  Did say she had N/V upon returning to her room.  Pain being managed.  No nausea today, VSS.

## 2012-08-24 NOTE — Progress Notes (Signed)
Patient sat up on the side of the bed at ~1600. Patient became very nauseous. Feels better when resting in bed. Will continue to encourage dangling and ambulation.

## 2012-08-24 NOTE — Anesthesia Preprocedure Evaluation (Signed)
Anesthesia Evaluation  Patient identified by MRN, date of birth, ID band Patient awake    Reviewed: Allergy & Precautions, H&P , NPO status , Patient's Chart, lab work & pertinent test results  History of Anesthesia Complications (+) PONV  Airway Mallampati: II TM Distance: >3 FB     Dental  (+) Teeth Intact   Pulmonary neg pulmonary ROS,  breath sounds clear to auscultation        Cardiovascular + Valvular Problems/Murmurs MVP Rhythm:Regular Rate:Normal     Neuro/Psych    GI/Hepatic   Endo/Other  diabetes, Well Controlled, Type 2, Oral Hypoglycemic Agents  Renal/GU      Musculoskeletal   Abdominal   Peds  Hematology   Anesthesia Other Findings   Reproductive/Obstetrics                           Anesthesia Physical Anesthesia Plan  ASA: III  Anesthesia Plan: General   Post-op Pain Management:    Induction: Intravenous  Airway Management Planned: Oral ETT  Additional Equipment:   Intra-op Plan:   Post-operative Plan: Extubation in OR  Informed Consent: I have reviewed the patients History and Physical, chart, labs and discussed the procedure including the risks, benefits and alternatives for the proposed anesthesia with the patient or authorized representative who has indicated his/her understanding and acceptance.     Plan Discussed with:   Anesthesia Plan Comments:         Anesthesia Quick Evaluation

## 2012-08-24 NOTE — Transfer of Care (Signed)
Immediate Anesthesia Transfer of Care Note  Patient: Christina Aguirre  Procedure(s) Performed: Procedure(s) (LRB) with comments: HYSTERECTOMY VAGINAL (N/A) ANTERIOR REPAIR (CYSTOCELE) (N/A) - ANTERIOR REPAIR WITH GRAFT VAGINAL VAULT SUSPENSION (N/A) PUBO-VAGINAL SLING (N/A) CYSTOSCOPY (N/A)  Patient Location: PACU  Anesthesia Type: General  Level of Consciousness: sedated  Airway & Oxygen Therapy: Patient Spontanous Breathing and Patient connected to face mask oxygen  Post-op Assessment: Report given to PACU RN  Post vital signs: Reviewed and stable  Complications: No apparent anesthesia complications

## 2012-08-25 LAB — BASIC METABOLIC PANEL
CO2: 28 mEq/L (ref 19–32)
Chloride: 101 mEq/L (ref 96–112)
Creatinine, Ser: 0.67 mg/dL (ref 0.50–1.10)
Glucose, Bld: 147 mg/dL — ABNORMAL HIGH (ref 70–99)
Sodium: 136 mEq/L (ref 135–145)

## 2012-08-25 LAB — CBC
Hemoglobin: 12.1 g/dL (ref 12.0–15.0)
MCH: 28.7 pg (ref 26.0–34.0)
MCV: 86.3 fL (ref 78.0–100.0)
Platelets: 217 10*3/uL (ref 150–400)
RBC: 4.22 MIL/uL (ref 3.87–5.11)
WBC: 15.4 10*3/uL — ABNORMAL HIGH (ref 4.0–10.5)

## 2012-08-25 MED ORDER — ONDANSETRON HCL 4 MG/2ML IJ SOLN
INTRAMUSCULAR | Status: AC
  Administered 2012-08-25: 8 mg
  Filled 2012-08-25: qty 4

## 2012-08-25 NOTE — Progress Notes (Signed)
1 Day Post-Op Procedure(s) (LRB): HYSTERECTOMY VAGINAL (N/A) ANTERIOR REPAIR (CYSTOCELE) (N/A) VAGINAL VAULT SUSPENSION (N/A) PUBO-VAGINAL SLING (N/A) CYSTOSCOPY (N/A)  Subjective: Patient reports nausea, vomiting and incisional pain.    Objective: I have reviewed patient's vital signs, intake and output, medications and labs.  General: alert, cooperative and no distress GI: soft, non-tender; bowel sounds normal; no masses,  no organomegaly Vaginal Bleeding: minimal  Recent Results (from the past 24 hour(s))  GLUCOSE, CAPILLARY   Collection Time   08/24/12 11:40 AM      Component Value Range   Glucose-Capillary 183 (*) 70 - 99 mg/dL  CBC   Collection Time   08/25/12  5:46 AM      Component Value Range   WBC 15.4 (*) 4.0 - 10.5 K/uL   RBC 4.22  3.87 - 5.11 MIL/uL   Hemoglobin 12.1  12.0 - 15.0 g/dL   HCT 78.2  95.6 - 21.3 %   MCV 86.3  78.0 - 100.0 fL   MCH 28.7  26.0 - 34.0 pg   MCHC 33.2  30.0 - 36.0 g/dL   RDW 08.6  57.8 - 46.9 %   Platelets 217  150 - 400 K/uL  BASIC METABOLIC PANEL   Collection Time   08/25/12  5:46 AM      Component Value Range   Sodium 136  135 - 145 mEq/L   Potassium 3.9  3.5 - 5.1 mEq/L   Chloride 101  96 - 112 mEq/L   CO2 28  19 - 32 mEq/L   Glucose, Bld 147 (*) 70 - 99 mg/dL   BUN 7  6 - 23 mg/dL   Creatinine, Ser 6.29  0.50 - 1.10 mg/dL   Calcium 9.0  8.4 - 52.8 mg/dL   GFR calc non Af Amer >90  >90 mL/min   GFR calc Af Amer >90  >90 mL/min    Assessment: s/p Procedure(s) (LRB) with comments: HYSTERECTOMY VAGINAL (N/A) ANTERIOR REPAIR (CYSTOCELE) (N/A) - ANTERIOR REPAIR WITH GRAFT VAGINAL VAULT SUSPENSION (N/A) PUBO-VAGINAL SLING (N/A) CYSTOSCOPY (N/A): stable  Plan: Continue routine post operative care  LOS: 1 day    Rhylee Pucillo H 08/25/2012, 9:27 AM

## 2012-08-25 NOTE — Progress Notes (Signed)
Up to chair x 30 minutes. Tolerated fair. Became dizzy at times. Mild nausea which subsided quickly. States pain worse. Was premedicated with 2 percocet. Husband at bedside with her.  

## 2012-08-25 NOTE — Addendum Note (Signed)
Addendum  created 08/25/12 1047 by Moshe Salisbury, CRNA   Modules edited:Notes Section

## 2012-08-25 NOTE — Progress Notes (Signed)
UR Chart Review Completed-- OIB 

## 2012-08-25 NOTE — Progress Notes (Signed)
Inpatient Diabetes Program Recommendations  AACE/ADA: New Consensus Statement on Inpatient Glycemic Control  Target Ranges:  Prepandial:   less than 140 mg/dL      Peak postprandial:   less than 180 mg/dL (1-2 hours)      Critically ill patients:  140 - 180 mg/dL  Pager:  [phone] Hours:  8 am-10pm   Reason for Visit: History of DM and currently on oral agents  Inpatient Diabetes Program Recommendations Oral Agents: Please order CBGs while on oral agents  Alfredia Client PhD, RN, BC-ADM Diabetes Coordinator  Office:  [phone] Team Pager:  [phone]

## 2012-08-25 NOTE — Progress Notes (Signed)
Per phone conversation with Dr. Despina Hidden, as part of pt's surgical procedure, MD placed 2 grafts in clean contaminated field, which is why Cipro was ordered for pt.

## 2012-08-25 NOTE — Progress Notes (Deleted)
Up to chair x 30 minutes. Tolerated fair. Became dizzy at times. Mild nausea which subsided quickly. States pain worse. Was premedicated with 2 percocet. Husband at bedside with her.

## 2012-08-26 LAB — GLUCOSE, CAPILLARY: Glucose-Capillary: 139 mg/dL — ABNORMAL HIGH (ref 70–99)

## 2012-08-26 NOTE — Progress Notes (Signed)
2 Days Post-Op Procedure(s) (LRB): HYSTERECTOMY VAGINAL (N/A) ANTERIOR REPAIR (CYSTOCELE) (N/A) VAGINAL VAULT SUSPENSION (N/A) PUBO-VAGINAL SLING (N/A) CYSTOSCOPY (N/A)  Subjective: Patient reports nausea, vomiting, incisional pain and tolerating PO.    Objective: I have reviewed patient's vital signs, intake and output, medications and labs. Filed Vitals:   08/25/12 0551 08/25/12 1637 08/25/12 2052 08/26/12 0507  BP: 124/75 136/73 145/70 121/73  Pulse: 89 87 87 91  Temp: 97.8 F (36.6 C) 99.7 F (37.6 C) 100.2 F (37.9 C) 98.9 F (37.2 C)  TempSrc:  Oral Oral Oral  Resp: 16 18 16 16   Height:      Weight:      SpO2: 95% 95% 97% 98%   General: alert, cooperative and mild distress GI: normal findings: normal post op Vaginal Bleeding: minimal  Assessment: s/p Procedure(s) (LRB) with comments: HYSTERECTOMY VAGINAL (N/A) ANTERIOR REPAIR (CYSTOCELE) (N/A) - ANTERIOR REPAIR WITH GRAFT VAGINAL VAULT SUSPENSION (N/A) PUBO-VAGINAL SLING (N/A) CYSTOSCOPY (N/A): stable  Plan: Advance diet Encourage ambulation Advance to PO medication Discontinue IV fluids Patient is progressing slowly.  She is a bit hesitant to move as I would like, I have encouraged to do so.  Additionally as of right now my evaluation is that she could not go home at this moment.  i will reevaluate later today for possible discharge today, but likely tomorrow.  LOS: 2 days  Patient aware of my assessment and plan.    Conrad Zajkowski H 08/26/2012, 9:27 AM

## 2012-08-26 NOTE — Care Management Note (Unsigned)
    Page 1 of 1   08/26/2012     3:48:58 PM   CARE MANAGEMENT NOTE 08/26/2012  Patient:  Christina Aguirre, Christina Aguirre   Account Number:  [id number]  Date Initiated:  08/26/2012  Documentation initiated by:  Sharrie Rothman  Subjective/Objective Assessment:   Pt admitted from home s/p hysterectomy. Pt lives with her husband and will return home at discharge. Pt is independent with ADL's.     Action/Plan:   No CM or HH needs noted.   Anticipated DC Date:  08/27/2012   Anticipated DC Plan:  HOME/SELF CARE      DC Planning Services  CM consult      Choice offered to / List presented to:             Status of service:  Completed, signed off Medicare Important Message given?   (If response is "NO", the following Medicare IM given date fields will be blank) Date Medicare IM given:   Date Additional Medicare IM given:    Discharge Disposition:    Per UR Regulation:    If discussed at Long Length of Stay Meetings, dates discussed:    Comments:  08/26/12 1548 Arlyss Queen, RN BSN CM

## 2012-08-27 LAB — GLUCOSE, CAPILLARY: Glucose-Capillary: 155 mg/dL — ABNORMAL HIGH (ref 70–99)

## 2012-08-27 MED ORDER — ZOLPIDEM TARTRATE 10 MG PO TABS: 10.0000 mg | ORAL_TABLET | Freq: Every evening | ORAL | Status: DC | PRN

## 2012-08-27 MED ORDER — CIPROFLOXACIN HCL 500 MG PO TABS: 500.0000 mg | ORAL_TABLET | Freq: Two times a day (BID) | ORAL | Status: DC

## 2012-08-27 MED ORDER — KETOROLAC TROMETHAMINE 10 MG PO TABS: 10.0000 mg | ORAL_TABLET | Freq: Three times a day (TID) | ORAL | Status: DC | PRN

## 2012-08-27 MED ORDER — OXYCODONE-ACETAMINOPHEN 5-325 MG PO TABS: 1.0000 | ORAL_TABLET | ORAL | Status: DC | PRN

## 2012-08-27 MED ORDER — ONDANSETRON HCL 8 MG PO TABS: 8.0000 mg | ORAL_TABLET | Freq: Four times a day (QID) | ORAL | Status: DC | PRN

## 2012-08-27 NOTE — Progress Notes (Signed)
Discharge Summary: a/o.vss. Saline lock removed. Foley intact. Leg bag attached to patient. Instructed pt on foley care after discharge. Discharge instructions given. Prescriptions given. Pt verbalized understanding of instructions. Awaiting for family to arrive for discharge.

## 2012-08-27 NOTE — Discharge Summary (Signed)
Physician Discharge Summary  Patient ID: Christina Aguirre MRN: [id number] DOB/AGE: [birth year] 56 y.o.  Admit date: 08/24/2012 Discharge date: 08/27/2012  Admission Diagnoses:Anterior compartment prolapse, uterine prolapse  Discharge Diagnoses:  Same and status post TVH, anterior colporrhaphy with placement of graft, retropubic sling with graft, vagiall vault suspension, interspinous,  Discharged Condition: good  Hospital Course: unremarkable  Consults: None  Significant Diagnostic Studies: none  Treatments: antibiotics: Cipro  Discharge Exam: Blood pressure 138/91, pulse 90, temperature 98.2 F (36.8 C), temperature source Oral, resp. rate 18, height 5\' 8"  (1.727 m), weight 169 lb 15.6 oz (77.1 kg), SpO2 100.00%. General appearance: alert, cooperative and no distress GI: soft, non-tender; bowel sounds normal; no masses,  no organomegaly  Disposition: Final discharge disposition not confirmed  Discharge Orders    Future Orders Please Complete By Expires   Urinary leg bag      Diet - low sodium heart healthy      Increase activity slowly      Driving Restrictions      Comments:   Nope   Lifting restrictions      Comments:   Do not lift anything   Sexual Activity Restrictions      Comments:   Are you kidding?   No wound care      Call MD for:  temperature >100.4      Call MD for:  persistant nausea and vomiting      Call MD for:  severe uncontrolled pain          Medication List     As of 08/27/2012  9:34 AM    STOP taking these medications         ibuprofen 200 MG tablet   Commonly known as: ADVIL,MOTRIN      TAKE these medications         cetirizine 10 MG tablet   Commonly known as: ZYRTEC   Take 10 mg by mouth daily.      ciprofloxacin 500 MG tablet   Commonly known as: CIPRO   Take 1 tablet (500 mg total) by mouth 2 (two) times daily.      fluticasone 50 MCG/ACT nasal spray   Commonly known as: FLONASE   Place 2 sprays into the nose daily.     glimepiride 1 MG tablet   Commonly known as: AMARYL   Take 1 mg by mouth daily before breakfast.      ketorolac 10 MG tablet   Commonly known as: TORADOL   Take 1 tablet (10 mg total) by mouth every 8 (eight) hours as needed for pain.      metFORMIN 500 MG 24 hr tablet   Commonly known as: GLUCOPHAGE-XR   Take 500 mg by mouth daily with breakfast.      ondansetron 8 MG tablet   Commonly known as: ZOFRAN   Take 1 tablet (8 mg total) by mouth every 6 (six) hours as needed for nausea.      oxyCODONE-acetaminophen 5-325 MG per tablet   Commonly known as: PERCOCET/ROXICET   Take 1-2 tablets by mouth every 4 (four) hours as needed (moderate to severe pain (when tolerating fluids)).      zolpidem 10 MG tablet   Commonly known as: AMBIEN   Take 1 tablet (10 mg total) by mouth at bedtime as needed for sleep.           Follow-up Information    Follow up with Lazaro Arms, MD. (Already scheduled)    Contact  information:   [address] [place] [address] [phone]          Signed: Lazaro Arms 08/27/2012, 9:34 AM

## 2012-09-04 ENCOUNTER — Emergency Department (HOSPITAL_COMMUNITY): Payer: Medicaid Other

## 2012-09-04 ENCOUNTER — Encounter (HOSPITAL_COMMUNITY): Payer: Self-pay

## 2012-09-04 ENCOUNTER — Emergency Department (HOSPITAL_COMMUNITY)
Admission: EM | Admit: 2012-09-04 | Discharge: 2012-09-05 | Disposition: A | Discharge status: Home / Self Care | Payer: Medicaid Other | Attending: Emergency Medicine | Admitting: Emergency Medicine

## 2012-09-04 DIAGNOSIS — R109 Unspecified abdominal pain: Secondary | ICD-10-CM | POA: Insufficient documentation

## 2012-09-04 DIAGNOSIS — K6289 Other specified diseases of anus and rectum: Secondary | ICD-10-CM | POA: Insufficient documentation

## 2012-09-04 DIAGNOSIS — Z79899 Other long term (current) drug therapy: Secondary | ICD-10-CM | POA: Insufficient documentation

## 2012-09-04 DIAGNOSIS — E119 Type 2 diabetes mellitus without complications: Secondary | ICD-10-CM | POA: Insufficient documentation

## 2012-09-04 LAB — COMPREHENSIVE METABOLIC PANEL
ALT: 13 U/L (ref 0–35)
AST: 10 U/L (ref 0–37)
Alkaline Phosphatase: 85 U/L (ref 39–117)
CO2: 25 mEq/L (ref 19–32)
Chloride: 99 mEq/L (ref 96–112)
GFR calc non Af Amer: >90 mL/min (ref >90)
Potassium: 3.5 mEq/L (ref 3.5–5.1)
Sodium: 134 mEq/L — ABNORMAL LOW (ref 135–145)
Total Bilirubin: 0.2 mg/dL — ABNORMAL LOW (ref 0.3–1.2)

## 2012-09-04 LAB — CBC WITH DIFFERENTIAL/PLATELET
Basophils Absolute: 0 10*3/uL (ref 0.0–0.1)
Lymphocytes Relative: 8 % — ABNORMAL LOW (ref 12–46)
Lymphs Abs: 1.7 10*3/uL (ref 0.7–4.0)
Neutro Abs: 19.5 10*3/uL — ABNORMAL HIGH (ref 1.7–7.7)
Platelets: 390 10*3/uL (ref 150–400)
RBC: 4.68 MIL/uL (ref 3.87–5.11)
RDW: 14.1 % (ref 11.5–15.5)
WBC: 21.6 10*3/uL — ABNORMAL HIGH (ref 4.0–10.5)

## 2012-09-04 LAB — URINALYSIS, ROUTINE W REFLEX MICROSCOPIC
Bilirubin Urine: NEGATIVE
Glucose, UA: 250 mg/dL — AB
Hgb urine dipstick: NEGATIVE
Protein, ur: NEGATIVE mg/dL

## 2012-09-04 MED ORDER — HYDROMORPHONE HCL PF 1 MG/ML IJ SOLN
1.0000 mg | Freq: Once | INTRAMUSCULAR | Status: AC
  Administered 2012-09-04: 1 mg via INTRAVENOUS
  Filled 2012-09-04: qty 1

## 2012-09-04 MED ORDER — PROMETHAZINE HCL 25 MG/ML IJ SOLN
12.5000 mg | Freq: Once | INTRAMUSCULAR | Status: AC
  Administered 2012-09-04: 12.5 mg via INTRAVENOUS
  Filled 2012-09-04: qty 1

## 2012-09-04 MED ORDER — ONDANSETRON HCL 4 MG/2ML IJ SOLN
4.0000 mg | Freq: Once | INTRAMUSCULAR | Status: AC
  Administered 2012-09-04: 4 mg via INTRAVENOUS
  Filled 2012-09-04: qty 2

## 2012-09-04 MED ORDER — SODIUM CHLORIDE 0.9 % IV BOLUS (SEPSIS)
1000.0000 mL | Freq: Once | INTRAVENOUS | Status: AC
  Administered 2012-09-04: 1000 mL via INTRAVENOUS

## 2012-09-04 NOTE — ED Notes (Signed)
Patient had vaginal hysterectomy and bladder sling done 08/24/12.  She had been progressing well until just before bed tonight when she was trying to have BM.  She has used enema so as not to strain w/BM. She developed sudden, severe onset of pain in R and L lower abd. And R lateral lumbar pain and rectum.  Crying out in pain.

## 2012-09-04 NOTE — ED Notes (Signed)
Severe abdominal pain, nausea, rectal pain per pt. Had an vaginal hysterectomy on 08/24/12. Had cath in place since then.

## 2012-09-04 NOTE — ED Provider Notes (Signed)
History  This chart was scribed for [employer]. Colon Branch, MD by Erskine Emery. This patient was seen in room [facility] and the patient's care was started at 23:05.   CSN: [id number]  Arrival date & time 09/04/12  2157   First MD Initiated Contact with Patient 09/04/12 2305      Chief Complaint  Patient presents with  . Abdominal Pain  . Rectal Pain    (Consider location/radiation/quality/duration/timing/severity/associated sxs/prior treatment) The history is provided by the patient. No language interpreter was used.  Christina Aguirre is a 56 y.o. female who presents to the Emergency Department complaining of severe abdominal pain, nausea, and rectal pain since this evening, after eating while trying to have a bowel movement. Pt denies any rectal bleeding. Pt reports she did have a small bowel movement, the same consistency as her recent bowel movements, as she is taking stool softentiners. Pt had an enema but still had significant pain (over a 10/10 severity) so she decided to come into the ED. Pt had a vaginal hysterectomy on 08/24/12 (11 days ago), with a bladder suspension and catheter placement. Pt was sent home with zofran, percocet, toradol, and stool softeners and was healing well. Tonight the pt took a couple percocets that did not relieve her pain but she was given some Dilaudid upon arrival in the ED that took the edge off.  PCP Dr. Loney Hering OB/GYN Dr. Despina Hidden  Past Medical History  Diagnosis Date  . Diabetes mellitus   . PONV (postoperative nausea and vomiting)   . Arthritis   . Anemia   . Degenerative disc disease   . Mitral valve prolapse   . Ehler's-Danlos syndrome     Past Surgical History  Procedure Date  . Dilation and curettage of uterus     miscarriages  . Tonsillectomy   . Diagnostic laparoscopy   . Abdominal hysterectomy     No family history on file.  History  Substance Use Topics  . Smoking status: Never Smoker   . Smokeless tobacco: Not on file  .  Alcohol Use: 0.6 oz/week    1 Glasses of wine per week     wine    OB History    Grav Para Term Preterm Abortions TAB SAB Ect Mult Living                  Review of Systems  Constitutional: Negative for fever.       10 Systems reviewed and are negative for acute change except as noted in the HPI.  HENT: Negative for congestion.   Eyes: Negative for discharge and redness.  Respiratory: Negative for cough and shortness of breath.   Cardiovascular: Negative for chest pain.  Gastrointestinal: Positive for abdominal pain. Negative for vomiting.  Genitourinary: Positive for vaginal pain.       Rectal pain  Musculoskeletal: Negative for back pain.  Skin: Negative for rash.  Neurological: Negative for syncope, numbness and headaches.  Psychiatric/Behavioral:       No behavior change.     Allergies  Latex; Penicillins; Vancomycin; and Doxycycline  Home Medications   Current Outpatient Rx  Name Route Sig Dispense Refill  . CETIRIZINE HCL 10 MG PO TABS Oral Take 10 mg by mouth daily.    Marland Kitchen CIPROFLOXACIN HCL 500 MG PO TABS Oral Take 1 tablet (500 mg total) by mouth 2 (two) times daily. 28 tablet 0    Patient has indwelling catheter and 2 xenografts p ...  . FLUTICASONE PROPIONATE 50  MCG/ACT NA SUSP Nasal Place 2 sprays into the nose daily.    Marland Kitchen GLIMEPIRIDE 1 MG PO TABS Oral Take 1 mg by mouth daily before breakfast.    . KETOROLAC TROMETHAMINE 10 MG PO TABS Oral Take 1 tablet (10 mg total) by mouth every 8 (eight) hours as needed for pain. 15 tablet 0  . METFORMIN HCL ER 500 MG PO TB24 Oral Take 500 mg by mouth daily with breakfast.    . ONDANSETRON HCL 8 MG PO TABS Oral Take 1 tablet (8 mg total) by mouth every 6 (six) hours as needed for nausea. 20 tablet 0  . OXYCODONE-ACETAMINOPHEN 5-325 MG PO TABS Oral Take 1 tablet by mouth every 4 (four) hours as needed. pain    . ZOLPIDEM TARTRATE 10 MG PO TABS Oral Take 1 tablet (10 mg total) by mouth at bedtime as needed for sleep. 30 tablet  0    Triage Vitals: BP 148/89  Pulse 116  Temp 98.8 F (37.1 C) (Oral)  Resp 26  Ht 5' 8.5" (1.74 m)  Wt 155 lb (70.308 kg)  BMI 23.23 kg/m2  SpO2 100%  Physical Exam  Nursing note and vitals reviewed. Constitutional: She is oriented to person, place, and time. She appears well-developed and well-nourished. No distress.       Awake, alert, nontoxic appearance.  HENT:  Head: Normocephalic and atraumatic.  Eyes: EOM are normal. Pupils are equal, round, and reactive to light. Right eye exhibits no discharge. Left eye exhibits no discharge.  Neck: Neck supple. No tracheal deviation present.  Cardiovascular: Normal rate and normal heart sounds.   Pulmonary/Chest: Effort normal and breath sounds normal. No respiratory distress. She exhibits no tenderness.  Abdominal: Soft. She exhibits no distension. There is no tenderness. There is no rebound.       Lower abdominal tenderness to palpation.  Genitourinary:       Mild rectal tenderness. No hemorrhoids, internal or external. No impaction.Foley catheter in place.  Musculoskeletal: Normal range of motion. She exhibits no edema and no tenderness.       Baseline ROM, no obvious new focal weakness.  Neurological: She is alert and oriented to person, place, and time.       Mental status and motor strength appears baseline for patient and situation.  Skin: Skin is warm and dry. No rash noted.  Psychiatric: She has a normal mood and affect.    ED Course  Procedures (including critical care time) DIAGNOSTIC STUDIES: Oxygen Saturation is 100% on room air, normal by my interpretation.    COORDINATION OF CARE: 23:33--I evaluated the patient and we discussed a treatment plan including rectal exam, nausea medication, and pain medication to which the pt agreed.   Results for orders placed during the hospital encounter of 09/04/12  CBC WITH DIFFERENTIAL      Component Value Range   WBC 21.6 (*) 4.0 - 10.5 K/uL   RBC 4.68  3.87 - 5.11 MIL/uL    Hemoglobin 13.5  12.0 - 15.0 g/dL   HCT 47.8  29.5 - 62.1 %   MCV 86.5  78.0 - 100.0 fL   MCH 28.8  26.0 - 34.0 pg   MCHC 33.3  30.0 - 36.0 g/dL   RDW 30.8  65.7 - 84.6 %   Platelets 390  150 - 400 K/uL   Neutrophils Relative 91 (*) 43 - 77 %   Neutro Abs 19.5 (*) 1.7 - 7.7 K/uL   Lymphocytes Relative 8 (*) 12 -  46 %   Lymphs Abs 1.7  0.7 - 4.0 K/uL   Monocytes Relative 1 (*) 3 - 12 %   Monocytes Absolute 0.3  0.1 - 1.0 K/uL   Eosinophils Relative 0  0 - 5 %   Eosinophils Absolute 0.0  0.0 - 0.7 K/uL   Basophils Relative 0  0 - 1 %   Basophils Absolute 0.0  0.0 - 0.1 K/uL  COMPREHENSIVE METABOLIC PANEL      Component Value Range   Sodium 134 (*) 135 - 145 mEq/L   Potassium 3.5  3.5 - 5.1 mEq/L   Chloride 99  96 - 112 mEq/L   CO2 25  19 - 32 mEq/L   Glucose, Bld 188 (*) 70 - 99 mg/dL   BUN 11  6 - 23 mg/dL   Creatinine, Ser 1.61  0.50 - 1.10 mg/dL   Calcium 9.3  8.4 - 09.6 mg/dL   Total Protein 7.5  6.0 - 8.3 g/dL   Albumin 3.6  3.5 - 5.2 g/dL   AST 10  0 - 37 U/L   ALT 13  0 - 35 U/L   Alkaline Phosphatase 85  39 - 117 U/L   Total Bilirubin 0.2 (*) 0.3 - 1.2 mg/dL   GFR calc non Af Amer >90  >90 mL/min   GFR calc Af Amer >90  >90 mL/min  LIPASE, BLOOD      Component Value Range   Lipase 21  11 - 59 U/L  URINALYSIS, ROUTINE W REFLEX MICROSCOPIC      Component Value Range   Color, Urine YELLOW  YELLOW   APPearance CLEAR  CLEAR   Specific Gravity, Urine >1.030 (*) 1.005 - 1.030   pH 5.5  5.0 - 8.0   Glucose, UA 250 (*) NEGATIVE mg/dL   Hgb urine dipstick NEGATIVE  NEGATIVE   Bilirubin Urine NEGATIVE  NEGATIVE   Ketones, ur NEGATIVE  NEGATIVE mg/dL   Protein, ur NEGATIVE  NEGATIVE mg/dL   Urobilinogen, UA 0.2  0.0 - 1.0 mg/dL   Nitrite NEGATIVE  NEGATIVE   Leukocytes, UA NEGATIVE  NEGATIVE    Dg Abd Acute W/chest  09/04/2012  *RADIOLOGY REPORT*  Clinical Data: Abdominal pain.  Rule out perforated viscus.  ACUTE ABDOMEN SERIES (ABDOMEN 2 VIEW & CHEST 1 VIEW)   Comparison: None.  Findings: There is no free air to suggest a perforated viscus.  The bowel gas pattern is normal.  There are surgical vascular clips in the pelvis.  The soft tissues are otherwise unremarkable, but not particularly well defined.  The included frontal chest radiograph demonstrates a normal heart, mediastinum and hila and clear lungs.  No osteoblastic or osteolytic lesions.  IMPRESSION: No acute findings.  No free air.   Original Report Authenticated By: Domenic Moras, M.D.      No diagnosis found.    MDM  Patient with lower abdominal pain, rectal pain, nausea tonight s/p vaginal hysterectomy 08/24/12. Given analgesic x 2 and antiemetic x 2 with relief. Patient has appointment with OB/GYN tomorrow. Patient is currently on antibiotic, analgesic and antiinflammatory. Labs with elevated wbc which could be reactive. With close follow up with OB/GYN, value can be monitored.  Pt feels improved after observation and/or treatment in ED. Dx testing d/w pt and husband.  Questions answered.  Verb understanding, agreeable to d/c home with outpt f/u. Pt stable in ED with no significant deterioration in condition.The patient appears reasonably screened and/or stabilized for discharge and I doubt any  other medical condition or other [facility] requiring further screening, evaluation, or treatment in the ED at this time prior to discharge.  I personally performed the services described in this documentation, which was scribed in my presence. The recorded information has been reviewed and considered.   MDM Reviewed: nursing note and vitals Interpretation: labs and x-ray           Nicoletta Dress. Colon Branch, MD 09/05/12 1610

## 2012-09-05 ENCOUNTER — Encounter (HOSPITAL_COMMUNITY): Payer: Self-pay | Admitting: Obstetrics & Gynecology

## 2012-12-07 NOTE — Op Note (Signed)
Preoperative diagnosis:  1.  Grade III-IV uterine prolapse                                         2.  Grade IV cystocoele                                         3.  Minimal to no rectocoele                                         4.  Ehlers-Danlos syndrome, Grade II                                         5.  Pre operative urinary retention secondary to #2  Postoperative diagnosis:  Same as above  Procedure:  1.  Vaginal hysterectomy                     2.  Anterior colporrhaphy with placement of a xenograft, attached to sacrospinous ligament and pubo cervical fascia                     3.  Interspinous vaginal vault suspension                     4.  Retropubic mid urethral sling placement using xenograft    Surgeon:  Lazaro Arms MD  Assistant:  Christin Bach, MD  Anesthesia:  General Endotracheal  Findings:  Patient had seen me previously several years ago with complaint of pelvic prolapse.  At that time she had grade 2-3 prolapse of the uterus and anterior compartment.  She is known to have the Ehlers-Danlos syndrome  grade 2.  At that time she wanted to delay surgery noting that it was probably something that was going to be required potentially multiple times giving her connective tissue disorder.  At that time also diagnosed her with type 2 diabetes which she has subsequently gotten under control with diet and oral hypoglycemics.    The patient returned a couple of months ago with increasing pressure pelvic pain and prolapse symptoms.  her prolapse had progressed to grade 3-4 and she was actually experiencing a good amount of urinary retention as a result.  We once again discussed in detail the surgical options which included a vaginal hysterectomy with placement of anterior compartment graft and a mid urethral retropubic sling.  Her symptoms were so bad that she was fitted with a Milex ring with support pessary to take the kink out of her urethra to better allow her to avoid.  Of  course with her Ehlers-Danlos syndrome she probably is also having some pressure generating issues.  The patient understands as a result of this she may have a prolonged postoperative self-catheterization course longer than most because of this.    Intraoperative findings were essentially the same as in the office.  Her uterus was normal.  The ovaries were very small high and close to the pelvic sidewall which made them not reasonable for vaginal removal.  Of course she did indeed  have the prolapse as described preoperatively but no other anatomical abnormalities were encountered.  Description of operation:  The patient was taken from the preoperative area to the operating room in stable condition. She was placed in the sitting position and underwent a spinal anesthetic. Once an adequate level of anesthesia was attained she was placed in the dorsal lithotomy position using the candycane stirrups. Patient was prepped and draped in the usual sterile fashion and a Foley catheter was placed.  A weighted speculum was placed and the cervix was grasped with thyroid tenaculums both anteriorly and posteriorly.  0.5% Marcaine plain was injected in a circumferential fashion about the cervix and the electrocautery unit was used to incise the vagina and push at all cervix.  The posterior cul-de-sac was then entered sharply without difficulty.  The uterosacral ligaments were clamped cut and inspection suture ligated and held.  The cardinal ligaments were then clamped cut transfixion suture ligated and cut. The anterior peritoneum was identified the anterior cul-de-sac was entered sharply without difficulty. The anterior and posterior leaves of the broad ligament were plicated and the uterine vessels were clamped cut and suture ligated. Serial pedicles were taken of the fundus with each pedicle being clamped cut and suture ligated. The utero-ovarian ligaments were crossclamped the uterus was removed and both pedicles were  transfixion suture ligated. There was good hemostasis of all the pedicles.  At this time I did not think that the ovaries could be safely removed, they were quite small high and very close to the pelvic sidewall.    I then turned my attention to the anterior compartment.  The peritoneum was closed in a pursestring fashion.  The vagina was then dissected off of the bladder.  The bladder volume was tremendous consistent with her preoperative urinary retention and prolapse.  A pursestring suture was placed to reduce the bladder volume.  This was done carefully to avoid any kinking of the ureteral vesicle junction.  I then dissected the bladder laterally as well to gain access to the sacrospinous ligament.  The fetal bovine xenograft was then cut in the usual fashion.  It was anchored posteriorly to the sacrospinous ligaments bilaterally.  Her ligaments were somewhat attenuated consistent with her connective tissue disorder.  As usual I took several attempts to get a good anchoring to the sacrospinous ligament.  Once I was satisfied I pulled the suture and was able to actually moved the lower extremity and buttock without the ligament or the suture tearing.  This was accomplished with the capio needle retrieval system bilaterally.  I then trimmed the anterior portion of the graft so was not to interfere with the urethrovesical junction and the bladder neck.  I then anchored anteriorly to the pubo vesical fascia.  I then turned my attention to the mid urethral sling.  The dissection started been performed adequately.  2 incisions were made 2 cm from the midline just above the pubis.  The Pelvilace porcine xenograft was used.  The trochars were placed bilaterally without difficulty.  Cystoscopic evaluation of the bladder revealed no bladder perforation.  The pelvilace was then retracted using the trochars without difficulty.  As is my usual routine I left plenty of slack in the ratcheting system to allow for  postoperative retraction.  The sling was not in contact with the urethra and in fact I used a Kelly clamp to turn the graft and loosen it 1 more ratchet to prevent postoperative stricture and to minimize the chance of postoperative  or erosion.  I trimmed some of the vagina bilaterally but not a great deal and close the vagina to just where it meets the posterior incision.  I then did an interspinous vaginal vault suspension anchoring the posterior vagina posterior portion of the anterior graft and anterior vagina in several locations along the open cuff.  There was excellent cuff support at this point.  I then reevaluated the attachments of the graft and was comfortable in all areas.   The anterior posterior vagina was closed in interrupted fashion with good resultant hemostasis.     The sponge needle and instrument counts were correct x 3.  Total blood loss for the procedure was 250 cc.  The patient received 2 g of Ancef and 30 mg of Toradol IV preoperatively prophylactically.  She was taken to the recovery room in good stable condition awake alert doing well.  EURE,LUTHER H

## 2013-01-07 ENCOUNTER — Other Ambulatory Visit: Payer: Self-pay

## 2013-03-23 ENCOUNTER — Telehealth: Payer: Self-pay | Admitting: Obstetrics & Gynecology

## 2013-03-23 MED ORDER — SULFAMETHOXAZOLE-TRIMETHOPRIM 800-160 MG PO TABS: 1.0000 | ORAL_TABLET | Freq: Two times a day (BID) | ORAL | Status: DC

## 2013-03-23 NOTE — Telephone Encounter (Signed)
Pt with symptoms of a UTI Rx  bacrtrim ds bid x 7d

## 2013-03-23 NOTE — Telephone Encounter (Signed)
Surgery last October, continues to have cloudy urine and difficulty with self catherization, c/o of lower back pain that extends to front abd.and bladder spasms.

## 2013-04-06 ENCOUNTER — Telehealth: Payer: Self-pay | Admitting: Obstetrics & Gynecology

## 2013-04-10 ENCOUNTER — Telehealth: Payer: Self-pay | Admitting: *Deleted

## 2013-04-10 ENCOUNTER — Ambulatory Visit (INDEPENDENT_AMBULATORY_CARE_PROVIDER_SITE_OTHER): Payer: Medicaid Other | Admitting: Obstetrics & Gynecology

## 2013-04-10 ENCOUNTER — Encounter: Payer: Self-pay | Admitting: Obstetrics & Gynecology

## 2013-04-10 VITALS — BP 124/80 | Wt 162.0 lb

## 2013-04-10 DIAGNOSIS — N993 Prolapse of vaginal vault after hysterectomy: Secondary | ICD-10-CM

## 2013-04-10 NOTE — Telephone Encounter (Signed)
Pt states has a opening between her anus and vagina that is draining a pus like drainage. Pt told to be here today to see Dr. Despina Hidden at 1:30.

## 2013-04-10 NOTE — Telephone Encounter (Signed)
Pt came to office today and problem evaluated

## 2013-04-19 ENCOUNTER — Encounter: Payer: Self-pay | Admitting: *Deleted

## 2013-04-20 ENCOUNTER — Telehealth: Payer: Self-pay | Admitting: Obstetrics & Gynecology

## 2013-04-20 ENCOUNTER — Encounter: Payer: Self-pay | Admitting: Obstetrics & Gynecology

## 2013-04-20 ENCOUNTER — Ambulatory Visit: Payer: Self-pay | Admitting: Obstetrics & Gynecology

## 2013-04-20 NOTE — Telephone Encounter (Signed)
Patient actually able to void a little +/- 50% of total bladder volume.  Pt is encouraged and I am too.

## 2013-04-20 NOTE — Progress Notes (Signed)
Patient ID: Christina Aguirre, female   DOB: 1956/08/02, 57 y.o.   MRN: [id number] Patient in concerned about possible communication between her rectum and vagina.  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

## 2013-05-22 ENCOUNTER — Telehealth: Payer: Self-pay | Admitting: Obstetrics & Gynecology

## 2013-05-22 NOTE — Telephone Encounter (Signed)
Pt informed she can pick up catheters tomorrow per Dr. Forestine Chute nurse, Peggy.

## 2013-09-12 ENCOUNTER — Telehealth: Payer: Self-pay | Admitting: Obstetrics & Gynecology

## 2013-09-12 MED ORDER — CIPROFLOXACIN HCL 500 MG PO TABS: 500.0000 mg | ORAL_TABLET | Freq: Two times a day (BID) | ORAL | Status: DC

## 2013-09-12 NOTE — Telephone Encounter (Signed)
Pt states has cloudy and foul smelling urination, "bit of discomfort with voiding and catherization" glucose elevated. Pt states has increase water and cranberry juice intake.

## 2013-09-12 NOTE — Telephone Encounter (Signed)
Probable UTI  Cipro e prescribed

## 2013-09-28 ENCOUNTER — Other Ambulatory Visit: Payer: Self-pay

## 2014-01-29 ENCOUNTER — Encounter (HOSPITAL_BASED_OUTPATIENT_CLINIC_OR_DEPARTMENT_OTHER): Payer: Self-pay | Admitting: *Deleted

## 2014-01-29 NOTE — Progress Notes (Signed)
Will go to [facility] for bmet-[id number]- 

## 2014-01-30 ENCOUNTER — Other Ambulatory Visit: Payer: Self-pay

## 2014-01-30 ENCOUNTER — Encounter (HOSPITAL_COMMUNITY)
Admission: RE | Admit: 2014-01-30 | Discharge: 2014-01-30 | Disposition: A | Discharge status: Home / Self Care | Payer: Medicaid Other | Source: Ambulatory Visit | Attending: Orthopedic Surgery | Admitting: Orthopedic Surgery

## 2014-01-30 LAB — BASIC METABOLIC PANEL
BUN: 12 mg/dL (ref 6–23)
CHLORIDE: 99 meq/L (ref 96–112)
CO2: 31 mEq/L (ref 19–32)
CREATININE: 0.67 mg/dL (ref 0.50–1.10)
Calcium: 9.9 mg/dL (ref 8.4–10.5)
Glucose, Bld: 233 mg/dL — ABNORMAL HIGH (ref 70–99)
POTASSIUM: 4 meq/L (ref 3.7–5.3)
Sodium: 142 mEq/L (ref 137–147)

## 2014-01-31 NOTE — H&P (Signed)
[facility] [address]   [address] [place], [place] [address] [phone] A Division of [facility]  Loreta Ave, M.D.   Robert A. Thurston Hole, M.D.   Burnell Blanks, M.D.   Eulas Post, M.D.   Lunette Stands, M.D Christina Baize. Eulah Pont, M.D.  Buford Dresser, M.D.  Estell Harpin, M.D.    Melina Fiddler, M.D. Mary L. Isidoro Donning, PA-C  Kirstin A. Shepperson, PA-C  Josh Mayfield [place], PA-C [place], [place]                                                                    RE: Christina Aguirre, Christina Aguirre                                    [id number]      DOB: Jan 17, 1956 INITIAL EVALUATION:  01-29-14 History: She is a very pleasant 58 year-old female who presents today for left hand pain.  She was seen by Dr. Cleophas Dunker in [place] and was referred to Dr. Eulah Pont for surgical referral.  She has been taking 800 mg of Motrin and has not had any type of immobilization for this.  She was carrying a crock pot from her church dinner when she started to lose her balance.  She fell and does not remember how she exactly injured her hand.  She did not lose consciousness and she does not have pain anywhere else.  She does report some numbness in her third, fourth and fifth fingers and up to the elbow.  She is right hand dominant.  She is diabetic and uses Metformin to control her diabetes.   Family history: She has a strong family history for diabetes and hypertension, as well as cardiovascular disease.   Social history: She is a nonsmoker and is currently retired.  Please see associated documentation for this clinic visit for further past medical, family, surgical and social history, review of systems, and exam findings as this was reviewed by me.  EXAMINATION: On exam, clinically she has a shortened fourth metacarpal.  There does not appear to be any rotational deformity, but she is significantly shorter.  She is very tender to palpation and is limited in range of  motion.    X-RAYS: X-rays taken by Dr. Cleophas Dunker show a somewhat shortened and mildly angulated left fourth metacarpal fracture.  No other bony abnormalities are noted.    ASSESSMENT: Left hand closed metacarpal fracture of the fourth metacarpal.   PLAN: This is an acute severe injury for her.  She is unable to do many of her activities of daily living.  These activities include, but are not limited to, cooking, cleaning and dressing herself with any ease.  If left in this shortened angulated position may ultimately lead to permanent dysfunction.  We are going to plan for operative intervention of the fourth metacarpal.  We will plan for open reduction internal fixation utilizing likely plate and screw fixation, but may give consideration to just screw fixation if the fracture fragments will align and can maintain stability. We will get her scheduled for this at her convenience and have her follow up in the office postoperatively.  Christina Baizeimothy D.  Eulah Aguirre, M.D. Electronically verified by Christina Aguirre, M.D. TDM(BP):jjh Cc: Dr. Ernestine ConradKirk Bluth, fax: [phone] D 01-29-14 T 01-30-14

## 2014-02-01 ENCOUNTER — Ambulatory Visit (HOSPITAL_BASED_OUTPATIENT_CLINIC_OR_DEPARTMENT_OTHER)
Admission: RE | Admit: 2014-02-01 | Discharge: 2014-02-01 | Disposition: A | Discharge status: Home / Self Care | Payer: Medicaid Other | Source: Ambulatory Visit | Attending: Orthopedic Surgery | Admitting: Orthopedic Surgery

## 2014-02-01 ENCOUNTER — Ambulatory Visit (HOSPITAL_BASED_OUTPATIENT_CLINIC_OR_DEPARTMENT_OTHER): Payer: Medicaid Other | Admitting: Anesthesiology

## 2014-02-01 ENCOUNTER — Encounter (HOSPITAL_BASED_OUTPATIENT_CLINIC_OR_DEPARTMENT_OTHER): Payer: Self-pay | Admitting: *Deleted

## 2014-02-01 ENCOUNTER — Encounter (HOSPITAL_BASED_OUTPATIENT_CLINIC_OR_DEPARTMENT_OTHER): Payer: Medicaid Other | Admitting: Anesthesiology

## 2014-02-01 ENCOUNTER — Encounter (HOSPITAL_BASED_OUTPATIENT_CLINIC_OR_DEPARTMENT_OTHER)
Admission: RE | Disposition: A | Discharge status: Home / Self Care | Payer: Self-pay | Source: Ambulatory Visit | Attending: Orthopedic Surgery

## 2014-02-01 DIAGNOSIS — E119 Type 2 diabetes mellitus without complications: Secondary | ICD-10-CM | POA: Insufficient documentation

## 2014-02-01 DIAGNOSIS — S62309A Unspecified fracture of unspecified metacarpal bone, initial encounter for closed fracture: Secondary | ICD-10-CM | POA: Insufficient documentation

## 2014-02-01 DIAGNOSIS — W010XXA Fall on same level from slipping, tripping and stumbling without subsequent striking against object, initial encounter: Secondary | ICD-10-CM | POA: Insufficient documentation

## 2014-02-01 HISTORY — DX: Other specified health status: Z78.9

## 2014-02-01 HISTORY — PX: OPEN REDUCTION INTERNAL FIXATION (ORIF) METACARPAL: SHX6234

## 2014-02-01 HISTORY — DX: Personal history of other diseases of the respiratory system: Z87.09

## 2014-02-01 LAB — GLUCOSE, CAPILLARY
GLUCOSE-CAPILLARY: 140 mg/dL — AB (ref 70–99)
Glucose-Capillary: 178 mg/dL — ABNORMAL HIGH (ref 70–99)

## 2014-02-01 LAB — POCT HEMOGLOBIN-HEMACUE: HEMOGLOBIN: 14.3 g/dL (ref 12.0–15.0)

## 2014-02-01 SURGERY — OPEN REDUCTION INTERNAL FIXATION (ORIF) METACARPAL
Anesthesia: General | Site: Finger | Laterality: Left

## 2014-02-01 MED ORDER — FENTANYL CITRATE 0.05 MG/ML IJ SOLN
INTRAMUSCULAR | Status: DC | PRN
  Administered 2014-02-01 (×2): 50 ug via INTRAVENOUS
  Administered 2014-02-01: 100 ug via INTRAVENOUS

## 2014-02-01 MED ORDER — FENTANYL CITRATE 0.05 MG/ML IJ SOLN
INTRAMUSCULAR | Status: AC
  Filled 2014-02-01: qty 6

## 2014-02-01 MED ORDER — ONDANSETRON HCL 4 MG/2ML IJ SOLN: 4.0000 mg | Freq: Four times a day (QID) | INTRAMUSCULAR | Status: DC | PRN

## 2014-02-01 MED ORDER — PROMETHAZINE HCL 25 MG/ML IJ SOLN
6.2500 mg | INTRAMUSCULAR | Status: DC | PRN
  Administered 2014-02-01: 6.25 mg via INTRAVENOUS

## 2014-02-01 MED ORDER — OXYCODONE HCL 5 MG PO TABS
ORAL_TABLET | ORAL | Status: AC
  Filled 2014-02-01: qty 1

## 2014-02-01 MED ORDER — ACETAMINOPHEN 500 MG PO TABS
ORAL_TABLET | ORAL | Status: AC
  Filled 2014-02-01: qty 2

## 2014-02-01 MED ORDER — FENTANYL CITRATE 0.05 MG/ML IJ SOLN
25.0000 ug | INTRAMUSCULAR | Status: DC | PRN
  Administered 2014-02-01 (×2): 50 ug via INTRAVENOUS

## 2014-02-01 MED ORDER — FENTANYL CITRATE 0.05 MG/ML IJ SOLN
INTRAMUSCULAR | Status: AC
  Filled 2014-02-01: qty 4

## 2014-02-01 MED ORDER — PROPOFOL 10 MG/ML IV BOLUS
INTRAVENOUS | Status: DC | PRN
  Administered 2014-02-01: 200 mg via INTRAVENOUS

## 2014-02-01 MED ORDER — LACTATED RINGERS IV SOLN
INTRAVENOUS | Status: DC
  Administered 2014-02-01 (×2): via INTRAVENOUS

## 2014-02-01 MED ORDER — CLINDAMYCIN PHOSPHATE 900 MG/50ML IV SOLN
900.0000 mg | INTRAVENOUS | Status: AC
  Administered 2014-02-01: 900 mg via INTRAVENOUS

## 2014-02-01 MED ORDER — SCOPOLAMINE 1 MG/3DAYS TD PT72
MEDICATED_PATCH | TRANSDERMAL | Status: AC
  Filled 2014-02-01: qty 1

## 2014-02-01 MED ORDER — 0.9 % SODIUM CHLORIDE (POUR BTL) OPTIME
TOPICAL | Status: DC | PRN
  Administered 2014-02-01: 200 mL

## 2014-02-01 MED ORDER — OXYCODONE HCL 5 MG PO TABS: 10.0000 mg | ORAL_TABLET | ORAL | Status: DC | PRN

## 2014-02-01 MED ORDER — ONDANSETRON HCL 4 MG PO TABS: 4.0000 mg | ORAL_TABLET | Freq: Three times a day (TID) | ORAL | Status: DC | PRN

## 2014-02-01 MED ORDER — BUPIVACAINE HCL (PF) 0.25 % IJ SOLN
INTRAMUSCULAR | Status: DC | PRN
  Administered 2014-02-01: 8 mL

## 2014-02-01 MED ORDER — DOCUSATE SODIUM 100 MG PO CAPS: 100.0000 mg | ORAL_CAPSULE | Freq: Two times a day (BID) | ORAL | Status: DC

## 2014-02-01 MED ORDER — SCOPOLAMINE 1 MG/3DAYS TD PT72
1.0000 | MEDICATED_PATCH | TRANSDERMAL | Status: DC
  Administered 2014-02-01: 1.5 mg via TRANSDERMAL

## 2014-02-01 MED ORDER — MIDAZOLAM HCL 5 MG/5ML IJ SOLN
INTRAMUSCULAR | Status: DC | PRN
  Administered 2014-02-01: 2 mg via INTRAVENOUS

## 2014-02-01 MED ORDER — CLINDAMYCIN PHOSPHATE 900 MG/50ML IV SOLN
INTRAVENOUS | Status: AC
  Filled 2014-02-01: qty 50

## 2014-02-01 MED ORDER — DEXAMETHASONE SODIUM PHOSPHATE 10 MG/ML IJ SOLN
INTRAMUSCULAR | Status: DC | PRN
  Administered 2014-02-01: 10 mg via INTRAVENOUS

## 2014-02-01 MED ORDER — ONDANSETRON HCL 4 MG/2ML IJ SOLN
INTRAMUSCULAR | Status: DC | PRN
  Administered 2014-02-01: 4 mg via INTRAVENOUS

## 2014-02-01 MED ORDER — LIDOCAINE HCL (CARDIAC) 20 MG/ML IV SOLN
INTRAVENOUS | Status: DC | PRN
  Administered 2014-02-01: 100 mg via INTRAVENOUS

## 2014-02-01 MED ORDER — ACETAMINOPHEN 500 MG PO TABS
1000.0000 mg | ORAL_TABLET | Freq: Once | ORAL | Status: AC
  Administered 2014-02-01: 1000 mg via ORAL

## 2014-02-01 MED ORDER — MIDAZOLAM HCL 2 MG/2ML IJ SOLN
INTRAMUSCULAR | Status: AC
  Filled 2014-02-01: qty 2

## 2014-02-01 MED ORDER — MIDAZOLAM HCL 2 MG/2ML IJ SOLN: 1.0000 mg | INTRAMUSCULAR | Status: DC | PRN

## 2014-02-01 MED ORDER — SODIUM CHLORIDE 0.9 % IJ SOLN
INTRAMUSCULAR | Status: AC
  Filled 2014-02-01: qty 10

## 2014-02-01 MED ORDER — OXYCODONE HCL 5 MG/5ML PO SOLN: 5.0000 mg | Freq: Once | ORAL | Status: AC | PRN

## 2014-02-01 MED ORDER — FENTANYL CITRATE 0.05 MG/ML IJ SOLN: 50.0000 ug | INTRAMUSCULAR | Status: DC | PRN

## 2014-02-01 MED ORDER — FENTANYL CITRATE 0.05 MG/ML IJ SOLN
INTRAMUSCULAR | Status: AC
  Filled 2014-02-01: qty 2

## 2014-02-01 MED ORDER — DEXTROSE-NACL 5-0.45 % IV SOLN: 100.0000 mL/h | INTRAVENOUS | Status: DC

## 2014-02-01 MED ORDER — PROMETHAZINE HCL 25 MG/ML IJ SOLN
INTRAMUSCULAR | Status: AC
  Filled 2014-02-01: qty 1

## 2014-02-01 MED ORDER — OXYCODONE HCL 5 MG PO TABS
5.0000 mg | ORAL_TABLET | Freq: Once | ORAL | Status: AC | PRN
  Administered 2014-02-01: 5 mg via ORAL

## 2014-02-01 SURGICAL SUPPLY — 67 items
BANDAGE ELASTIC 4 VELCRO ST LF (GAUZE/BANDAGES/DRESSINGS) ×3 IMPLANT
BIT DRILL 1.9 AO (BIT) ×1
BIT DRILL 1.9MM AO (BIT) ×1 IMPLANT
BIT DRILL 14MM DIA AO (BIT) ×1 IMPLANT
BLADE SURG 15 STRL LF DISP TIS (BLADE) ×1 IMPLANT
BLADE SURG 15 STRL SS (BLADE) ×2
BNDG ESMARK 4X9 LF (GAUZE/BANDAGES/DRESSINGS) ×3 IMPLANT
CHLORAPREP W/TINT 26ML (MISCELLANEOUS) ×3 IMPLANT
CLOSURE WOUND 1/2 X4 (GAUZE/BANDAGES/DRESSINGS) ×1
CORDS BIPOLAR (ELECTRODE) ×3 IMPLANT
COVER TABLE BACK 60X90 (DRAPES) ×3 IMPLANT
CUFF TOURNIQUET SINGLE 18IN (TOURNIQUET CUFF) IMPLANT
CUFF TOURNIQUET SINGLE 24IN (TOURNIQUET CUFF) IMPLANT
DECANTER SPIKE VIAL GLASS SM (MISCELLANEOUS) IMPLANT
DRAPE EXTREMITY T 121X128X90 (DRAPE) ×3 IMPLANT
DRAPE OEC MINIVIEW 54X84 (DRAPES) ×3 IMPLANT
DRAPE SURG 17X23 STRL (DRAPES) IMPLANT
DRAPE U 20/CS (DRAPES) ×3 IMPLANT
DRILL BIT 1.9MM AO (BIT) ×2
DRILL BIT 14MM DIA AO (BIT) ×3
DRSG EMULSION OIL 3X3 NADH (GAUZE/BANDAGES/DRESSINGS) ×3 IMPLANT
GAUZE XEROFORM 1X8 LF (GAUZE/BANDAGES/DRESSINGS) IMPLANT
GLOVE BIO SURGEON STRL SZ7.5 (GLOVE) ×3 IMPLANT
GLOVE BIO SURGEON STRL SZ8 (GLOVE) ×3 IMPLANT
GLOVE BIOGEL PI IND STRL 8 (GLOVE) ×2 IMPLANT
GLOVE BIOGEL PI INDICATOR 8 (GLOVE) ×4
GOWN STRL REUS W/ TWL LRG LVL3 (GOWN DISPOSABLE) ×4 IMPLANT
GOWN STRL REUS W/ TWL XL LVL3 (GOWN DISPOSABLE) ×1 IMPLANT
GOWN STRL REUS W/TWL LRG LVL3 (GOWN DISPOSABLE) ×8
GOWN STRL REUS W/TWL XL LVL3 (GOWN DISPOSABLE) ×2
NEEDLE HYPO 25X1 1.5 SAFETY (NEEDLE) ×3 IMPLANT
NS IRRIG 1000ML POUR BTL (IV SOLUTION) ×3 IMPLANT
PACK BASIN DAY SURGERY FS (CUSTOM PROCEDURE TRAY) ×3 IMPLANT
PAD CAST 4YDX4 CTTN HI CHSV (CAST SUPPLIES) ×1 IMPLANT
PADDING CAST ABS 4INX4YD NS (CAST SUPPLIES) ×2
PADDING CAST ABS COTTON 4X4 ST (CAST SUPPLIES) ×1 IMPLANT
PADDING CAST COTTON 4X4 STRL (CAST SUPPLIES) ×2
PLATE COMPRESSION 4H (Plate) ×3 IMPLANT
SCREW BONE 2.3X10MM (Screw) ×3 IMPLANT
SCREW BONE 2.3X8MM (Screw) ×6 IMPLANT
SCREW BONE 2.3X9MM (Screw) ×3 IMPLANT
SCREW CORT 1.7X10MM (Screw) ×3 IMPLANT
SCREW CORT 1.7X8MM (Screw) ×3 IMPLANT
SLEEVE SCD COMPRESS KNEE MED (MISCELLANEOUS) IMPLANT
SPLINT FAST PLASTER 5X30 (CAST SUPPLIES)
SPLINT PLASTER CAST FAST 5X30 (CAST SUPPLIES) IMPLANT
SPLINT PLASTER CAST XFAST 3X15 (CAST SUPPLIES) IMPLANT
SPLINT PLASTER CAST XFAST 4X15 (CAST SUPPLIES) IMPLANT
SPLINT PLASTER XTRA FAST SET 4 (CAST SUPPLIES)
SPLINT PLASTER XTRA FASTSET 3X (CAST SUPPLIES)
SPONGE GAUZE 4X4 12PLY (GAUZE/BANDAGES/DRESSINGS) ×3 IMPLANT
STRIP CLOSURE SKIN 1/2X4 (GAUZE/BANDAGES/DRESSINGS) ×2 IMPLANT
SUCTION FRAZIER TIP 10 FR DISP (SUCTIONS) IMPLANT
SUT ETHILON 3 0 PS 1 (SUTURE) IMPLANT
SUT MON AB 2-0 CT1 36 (SUTURE) ×3 IMPLANT
SUT MON AB 4-0 PC3 18 (SUTURE) IMPLANT
SUT PROLENE 3 0 PS 2 (SUTURE) IMPLANT
SUT VIC AB 2-0 SH 27 (SUTURE)
SUT VIC AB 2-0 SH 27XBRD (SUTURE) IMPLANT
SUT VIC AB 3-0 FS2 27 (SUTURE) IMPLANT
SYR BULB 3OZ (MISCELLANEOUS) ×3 IMPLANT
SYR CONTROL 10ML LL (SYRINGE) ×3 IMPLANT
TOWEL OR 17X24 6PK STRL BLUE (TOWEL DISPOSABLE) ×3 IMPLANT
TOWEL OR NON WOVEN STRL DISP B (DISPOSABLE) ×3 IMPLANT
TUBE CONNECTING 20'X1/4 (TUBING)
TUBE CONNECTING 20X1/4 (TUBING) IMPLANT
UNDERPAD 30X30 INCONTINENT (UNDERPADS AND DIAPERS) ×3 IMPLANT

## 2014-02-01 NOTE — Op Note (Signed)
02/01/2014  10:19 AM  PATIENT:  Christina Aguirre    PRE-OPERATIVE DIAGNOSIS:  Left ring finger metacarpal fracture - closed  POST-OPERATIVE DIAGNOSIS:  Same  PROCEDURE:  LEFT RING FINGER FRACTURE OPEN TREATMENT METACARPAL SINGLE INCLUDES INTERNAL FIXATION EACH BONE  SURGEON:  MURPHY, TIMOTHY, D, MD  ASSISTANT: None  ANESTHESIA:   Gen  PREOPERATIVE INDICATIONS:  [name] is a  58 y.o. female with a diagnosis of Left ring finger metacarpal fracture - closed who failed conservative measures and elected for surgical management.    The risks benefits and alternatives were discussed with the patient preoperatively including but not limited to the risks of infection, bleeding, nerve injury, cardiopulmonary complications, the need for revision surgery, among others, and the patient was willing to proceed.  OPERATIVE IMPLANTS: stryker mini plate  BLOOD LOSS: min  COMPLICATIONS: none  TOURNIQUET TIME: 45min  OPERATIVE PROCEDURE:  Patient was identified in the preoperative holding area and site was marked by me She was transported to the operating theater and placed on the table in supine position taking care to pad all bony prominences. After a preincinduction time out anesthesia was induced. The left upper extremity was prepped and draped in normal sterile fashion and a pre-incision timeout was performed. She received Clinda for preoperative antibiotics.   The left upper extremity was exsanguinated and tourniquet was inflated to 250 mm mercury. I then made a roughly 3 cm incision. I dissected down carefully to the periosteum protecting the extensor tendons I then incised the periosteum in line with the incision. I freed up the fracture ends and debrided the fracture site. I then performed a reduction maneuver clamped in place. I placed a lag screw across the fracture and after redirecting it once was happy with the bite. I then selected a 4 hole mini frag plate and placed in a  neutralization fashion. All screws had an excellent bite 2 multiple fluoroscopic views and was happy with the reduction and placement of hardware. I then thoroughly irrigated the wound I closed the periosteum over top of the plate to protect the extensor tendons lesions I then closed the skin in layers with a nonabsorbable stitch. Placed a sterile dressing and I infiltrated her dorsal wrist and hand with cc of quarter percent Marcaine. Using the PACU in stable condition.  POST OPERATIVE PLAN: Nonweightbearing of the hand she'll ambulate for DVT prophylaxis

## 2014-02-01 NOTE — Transfer of Care (Signed)
Immediate Anesthesia Transfer of Care Note  Patient: Christina Aguirre  Procedure(s) Performed: Procedure(s): LEFT RING FINGER FRACTURE OPEN TREATMENT METACARPAL SINGLE INCLUDES INTERNAL FIXATION EACH BONE (Left)  Patient Location: PACU  Anesthesia Type:General  Level of Consciousness: awake and alert   Airway & Oxygen Therapy: Patient Spontanous Breathing and Patient connected to face mask oxygen  Post-op Assessment: Report given to PACU RN and Post -op Vital signs reviewed and stable  Post vital signs: Reviewed and stable  Complications: No apparent anesthesia complications

## 2014-02-01 NOTE — Anesthesia Procedure Notes (Signed)
Procedure Name: LMA Insertion Date/Time: 02/01/2014 9:02 AM Performed by: Caren MacadamARTER, Christina Aguirre W Pre-anesthesia Checklist: Patient identified, Emergency Drugs available, Suction available and Patient being monitored Patient Re-evaluated:Patient Re-evaluated prior to inductionOxygen Delivery Method: Circle System Utilized Preoxygenation: Pre-oxygenation with 100% oxygen Intubation Type: IV induction Ventilation: Mask ventilation without difficulty LMA: LMA inserted LMA Size: 4.0 Number of attempts: 1 Airway Equipment and Method: bite block Placement Confirmation: positive ETCO2 and breath sounds checked- equal and bilateral Tube secured with: Tape Dental Injury: Teeth and Oropharynx as per pre-operative assessment

## 2014-02-01 NOTE — Anesthesia Preprocedure Evaluation (Signed)
Anesthesia Evaluation  Patient identified by MRN, date of birth, ID band Patient awake    Reviewed: Allergy & Precautions, H&P , NPO status , Patient's Chart, lab work & pertinent test results  History of Anesthesia Complications (+) PONV  Airway Mallampati: II  Neck ROM: full    Dental   Pulmonary          Cardiovascular     Neuro/Psych    GI/Hepatic   Endo/Other  diabetes, Type 2  Renal/GU      Musculoskeletal  (+) Arthritis -,   Abdominal   Peds  Hematology   Anesthesia Other Findings   Reproductive/Obstetrics                           Anesthesia Physical Anesthesia Plan  ASA: II  Anesthesia Plan: General   Post-op Pain Management:    Induction: Intravenous  Airway Management Planned: LMA  Additional Equipment:   Intra-op Plan:   Post-operative Plan:   Informed Consent: I have reviewed the patients History and Physical, chart, labs and discussed the procedure including the risks, benefits and alternatives for the proposed anesthesia with the patient or authorized representative who has indicated his/her understanding and acceptance.     Plan Discussed with: CRNA, Anesthesiologist and Surgeon  Anesthesia Plan Comments:         Anesthesia Quick Evaluation

## 2014-02-01 NOTE — Anesthesia Postprocedure Evaluation (Signed)
Anesthesia Post Note  Patient: Christina Aguirre  Procedure(s) Performed: Procedure(s) (LRB): LEFT RING FINGER FRACTURE OPEN TREATMENT METACARPAL SINGLE INCLUDES INTERNAL FIXATION EACH BONE (Left)  Anesthesia type: General  Patient location: PACU  Post pain: Pain level controlled and Adequate analgesia  Post assessment: Post-op Vital signs reviewed, Patient's Cardiovascular Status Stable, Respiratory Function Stable, Patent Airway and Pain level controlled  Last Vitals:  Filed Vitals:   02/01/14 1030  BP: 134/82  Pulse: 70  Temp: 36.6 C  Resp: 12    Post vital signs: Reviewed and stable  Level of consciousness: awake, alert  and oriented  Complications: No apparent anesthesia complications

## 2014-02-01 NOTE — Discharge Instructions (Signed)
Keep your dressing on and dry until follow up  No use of the left hand, you may wiggle fingers.         HAND SURGERY    HOME CARE INSTRUCTIONS    The following instructions have been prepared to help you care for yourself upon your return home today.  Wound Care:  Keep your hand elevated above the level of your heart. Do not allow it to dangle by your side. Keep the dressing dry and do not remove it unless your doctor advises you to do so. He will usually change it at the time of you post-op visit. Moving your fingers is advised to stimulate circulation but will depend on the site of your surgery. Of course, if you have a splint applied your doctor will advise you about movement.  Activity:  Do not drive or operate machinery today. Rest today and then you may return to your normal activity and work as indicated by your physician.  Diet: Drink liquids today or eat a light diet. You may resume a regular diet tomorrow.  General expectations: Pain for two or three days. Fingers may become slightly swollen.   Unexpected Observations- Call your doctor if any of these occur: Severe pain not relieved by pain medication. Elevated temperature. Dressing soaked with blood. Inability to move fingers. White or bluish color to fingers.   Post Anesthesia Home Care Instructions  Activity: Get plenty of rest for the remainder of the day. A responsible adult should stay with you for 24 hours following the procedure.  For the next 24 hours, DO NOT: -Drive a car -Advertising copywriterperate machinery -Drink alcoholic beverages -Take any medication unless instructed by your physician -Make any legal decisions or sign important papers.  Meals: Start with liquid foods such as gelatin or soup. Progress to regular foods as tolerated. Avoid greasy, spicy, heavy foods. If nausea and/or vomiting occur, drink only clear liquids until the nausea and/or vomiting subsides. Call your physician if vomiting  continues.  Special Instructions/Symptoms: Your throat may feel dry or sore from the anesthesia or the breathing tube placed in your throat during surgery. If this causes discomfort, gargle with warm salt water. The discomfort should disappear within 24 hours.

## 2014-02-01 NOTE — Interval H&P Note (Signed)
History and Physical Interval Note:  02/01/2014 7:35 AM  Christina Aguirre  has presented today for surgery, with the diagnosis of left ring finger metacarpal fracture - closed  The various methods of treatment have been discussed with the patient and family. After consideration of risks, benefits and other options for treatment, the patient has consented to  Procedure(s): LEFT RING FINGER FRACTURE OPEN TREATMENT METACARPAL SINGLE INCLUDES INTERNAL FIXATION EACH BONE (Left) as a surgical intervention .  The patient's history has been reviewed, patient examined, no change in status, stable for surgery.  I have reviewed the patient's chart and labs.  Questions were answered to the patient's satisfaction.     Selinda Korzeniewski, D

## 2014-02-05 ENCOUNTER — Encounter (HOSPITAL_BASED_OUTPATIENT_CLINIC_OR_DEPARTMENT_OTHER): Payer: Self-pay | Admitting: Orthopedic Surgery

## 2014-02-08 ENCOUNTER — Other Ambulatory Visit: Payer: Self-pay | Admitting: Obstetrics & Gynecology

## 2014-02-08 ENCOUNTER — Other Ambulatory Visit: Payer: Medicaid Other

## 2014-02-08 DIAGNOSIS — N390 Urinary tract infection, site not specified: Secondary | ICD-10-CM

## 2014-02-08 MED ORDER — SULFAMETHOXAZOLE-TMP DS 800-160 MG PO TABS
1.0000 | ORAL_TABLET | Freq: Two times a day (BID) | ORAL | Status: DC
Start: 2014-02-08 — End: 2016-09-24

## 2014-02-08 NOTE — Telephone Encounter (Signed)
+   nitrates  Rx bactrim

## 2014-02-10 LAB — URINE CULTURE: Colony Count: >=100000

## 2014-07-19 ENCOUNTER — Telehealth: Payer: Self-pay | Admitting: Obstetrics & Gynecology

## 2014-07-19 NOTE — Telephone Encounter (Signed)
Pt requesting in/out catheters, left at front desk for pt to pick up.

## 2014-09-24 ENCOUNTER — Encounter (HOSPITAL_BASED_OUTPATIENT_CLINIC_OR_DEPARTMENT_OTHER): Payer: Self-pay | Admitting: Orthopedic Surgery

## 2014-09-26 ENCOUNTER — Telehealth: Payer: Self-pay | Admitting: *Deleted

## 2014-09-26 NOTE — Telephone Encounter (Signed)
Pt states self cath daily, now feels bladder is full after cath, "thinks she has a bladder infection."

## 2014-09-27 ENCOUNTER — Other Ambulatory Visit: Payer: Medicaid Other

## 2014-09-27 ENCOUNTER — Telehealth: Payer: Self-pay | Admitting: Obstetrics & Gynecology

## 2014-09-27 DIAGNOSIS — N390 Urinary tract infection, site not specified: Secondary | ICD-10-CM

## 2014-09-27 MED ORDER — CIPROFLOXACIN HCL 500 MG PO TABS
500.0000 mg | ORAL_TABLET | Freq: Two times a day (BID) | ORAL | Status: DC
Start: 2014-09-27 — End: 2016-09-24

## 2014-09-27 NOTE — Telephone Encounter (Signed)
Pt has an appt today for urine sample for culture at 9:45 am

## 2014-09-27 NOTE — Telephone Encounter (Signed)
Have pt come in and give us a sample so i can also culture it. I have e prescribed antibiotic but i want a ua and culture first so I can check the bacteria.

## 2014-09-30 LAB — URINE CULTURE: Colony Count: >=100000

## 2015-01-03 ENCOUNTER — Telehealth: Payer: Self-pay | Admitting: Obstetrics & Gynecology

## 2015-01-03 NOTE — Telephone Encounter (Signed)
Catheters left at the front desk for pt to pick up. Pt aware.

## 2015-03-01 ENCOUNTER — Telehealth: Payer: Self-pay | Admitting: Obstetrics & Gynecology

## 2015-03-01 NOTE — Telephone Encounter (Signed)
Catheters left at front desk for pt to pick up. Pt aware.

## 2015-05-28 ENCOUNTER — Telehealth: Payer: Self-pay | Admitting: Obstetrics & Gynecology

## 2015-05-28 NOTE — Telephone Encounter (Signed)
Pt requesting catheters, left at the front desk for pick up.

## 2015-07-03 ENCOUNTER — Telehealth: Payer: Self-pay | Admitting: Obstetrics & Gynecology

## 2015-07-03 ENCOUNTER — Telehealth: Payer: Self-pay | Admitting: *Deleted

## 2015-07-03 ENCOUNTER — Other Ambulatory Visit (INDEPENDENT_AMBULATORY_CARE_PROVIDER_SITE_OTHER): Payer: Medicaid Other | Admitting: *Deleted

## 2015-07-03 DIAGNOSIS — Z1389 Encounter for screening for other disorder: Secondary | ICD-10-CM

## 2015-07-03 DIAGNOSIS — R319 Hematuria, unspecified: Secondary | ICD-10-CM

## 2015-07-03 LAB — POCT URINALYSIS DIPSTICK
Ketones, UA: NEGATIVE
Leukocytes, UA: NEGATIVE
Nitrite, UA: POSITIVE
Protein, UA: NEGATIVE

## 2015-07-03 NOTE — Progress Notes (Signed)
Pt reports having blood in urine last pm. Urine dipped here and it showed + nitrites, 1+ blood, and 5+ glucose. Urine sent for urine culture. JSY

## 2015-07-03 NOTE — Telephone Encounter (Signed)
Pt states she is calling to see if her urine culture results are back and states she is now having c/o urine urgency. Pt informed urine culture still pending. Dr. Despina Hidden has left the office for today back tomorrow am. Pt verbalized understanding.

## 2015-07-03 NOTE — Telephone Encounter (Signed)
Pt states she thinks she has UTI, odor and blood in urine. Pt states Dr. Despina Hidden is aware of her history and usually has her come in for UA dipstick and CX and usually prescribed ABX. Pt to come in this am for urine only. Pt informed will route message to Dr. Despina Hidden who will be in the office the afternoon. Pt verbalized understanding.

## 2015-07-03 NOTE — Telephone Encounter (Signed)
Pt brought urine into office and it showed nitrites and blood. Urine was sent to lab for culture. Do you want to go ahead and treat pt? Thanks!! JSY

## 2015-07-05 ENCOUNTER — Telehealth: Payer: Self-pay | Admitting: Obstetrics & Gynecology

## 2015-07-05 LAB — URINE CULTURE

## 2015-07-05 MED ORDER — SULFAMETHOXAZOLE-TRIMETHOPRIM 800-160 MG PO TABS: 1.0000 | ORAL_TABLET | Freq: Two times a day (BID) | ORAL | Status: DC

## 2015-07-05 NOTE — Telephone Encounter (Signed)
Pt informed of positive culture for Ecoli and Bactrim e-scribed to [employer]. Pt verbalized understanding.

## 2015-07-13 NOTE — Telephone Encounter (Signed)
Pt has been placed on bactrim per the sensitivities

## 2015-09-06 ENCOUNTER — Encounter: Payer: Self-pay | Admitting: Obstetrics & Gynecology

## 2015-09-06 ENCOUNTER — Ambulatory Visit (INDEPENDENT_AMBULATORY_CARE_PROVIDER_SITE_OTHER): Payer: Medicaid Other | Admitting: Obstetrics & Gynecology

## 2015-09-06 VITALS — BP 130/80 | HR 92 | Ht 68.0 in | Wt 159.0 lb

## 2015-09-06 DIAGNOSIS — Z1212 Encounter for screening for malignant neoplasm of rectum: Secondary | ICD-10-CM

## 2015-09-06 DIAGNOSIS — Z0000 Encounter for general adult medical examination without abnormal findings: Secondary | ICD-10-CM

## 2015-09-06 DIAGNOSIS — Z1211 Encounter for screening for malignant neoplasm of colon: Secondary | ICD-10-CM

## 2015-09-06 DIAGNOSIS — Z01419 Encounter for gynecological examination (general) (routine) without abnormal findings: Secondary | ICD-10-CM | POA: Diagnosis not present

## 2015-09-06 MED ORDER — ESTROGENS, CONJUGATED 0.625 MG/GM VA CREA: 1.0000 g | TOPICAL_CREAM | Freq: Every day | VAGINAL | Status: DC

## 2015-09-06 MED ORDER — TERCONAZOLE 0.4 % VA CREA: 1.0000 | TOPICAL_CREAM | Freq: Every day | VAGINAL | Status: DC

## 2015-09-06 NOTE — Progress Notes (Signed)
Patient ID: Christina Aguirre, female   DOB: 05/01/1956, 59 y.o.   MRN: [id number] Subjective:     Christina Aguirre is a 59 y.o. female here for a routine exam.  No LMP recorded. Patient has had a hysterectomy. [id number] Birth Control Method:  Hysterectomy Menstrual Calendar(currently): Hysterectomy  Current complaints: Cystocele and vaginal vault apex prolapse.   Current acute medical issues:  Diabetes recurrent UTIs  Ehlers-Danlos syndrome   Recent Gynecologic History No LMP recorded. Patient has had a hysterectomy. Last Pap: ,   Last mammogram: 2013,  normal  Past Medical History  Diagnosis Date  . Diabetes mellitus   . PONV (postoperative nausea and vomiting)   . Arthritis   . Anemia   . Degenerative disc disease   . Mitral valve prolapse     echo 20 yr ago  . Ehler's-Danlos syndrome   . History of bronchitis   . Self-catheterizes urinary bladder     2 times a day as needed    Past Surgical History  Procedure Laterality Date  . Dilation and curettage of uterus      miscarriages  . Tonsillectomy    . Diagnostic laparoscopy    . Abdominal hysterectomy    . Vaginal hysterectomy  08/24/2012    Procedure: HYSTERECTOMY VAGINAL;  Surgeon: Lazaro ArmsLuther H Eure, MD;  Location: [facility];  Service: Gynecology;  Laterality: N/A;  . Cystocele repair  08/24/2012    Procedure: ANTERIOR REPAIR (CYSTOCELE);  Surgeon: Lazaro ArmsLuther H Eure, MD;  Location: [facility];  Service: Gynecology;  Laterality: N/A;  ANTERIOR REPAIR WITH GRAFT  . Vaginal prolapse repair  08/24/2012    Procedure: VAGINAL VAULT SUSPENSION;  Surgeon: Lazaro ArmsLuther H Eure, MD;  Location: [facility];  Service: Gynecology;  Laterality: N/A;  . Pubovaginal sling  08/24/2012    Procedure: Leonides GrillsPUBO-VAGINAL SLING;  Surgeon: Lazaro ArmsLuther H Eure, MD;  Location: [facility];  Service: Gynecology;  Laterality: N/A;  . Cystoscopy  08/24/2012    Procedure: CYSTOSCOPY;  Surgeon: Lazaro ArmsLuther H Eure, MD;  Location: [facility];  Service: Gynecology;  Laterality: N/A;  . Open reduction internal  fixation (orif) metacarpal Left 02/01/2014    Procedure: LEFT RING FINGER FRACTURE OPEN TREATMENT METACARPAL SINGLE INCLUDES INTERNAL FIXATION EACH BONE;  Surgeon: Sheral Apleyimothy D Murphy, MD;  Location: [facility];  Service: Orthopedics;  Laterality: Left;    OB History    Gravida Para Term Preterm AB TAB SAB Ectopic Multiple Living   4 2   2  2          Social History   Social History  . Marital Status: Married    Spouse Name: N/A  . Number of Children: N/A  . Years of Education: N/A   Social History Main Topics  . Smoking status: Never Smoker   . Smokeless tobacco: Never Used  . Alcohol Use: 0.6 oz/week    1 Glasses of wine per week     Comment: wine  . Drug Use: No  . Sexual Activity: Yes    Birth Control/ Protection: Post-menopausal   Other Topics Concern  . None   Social History Narrative    Family History  Problem Relation Age of Onset  . Cancer Mother     colon   . Hypertension Other   . Diabetes Other   . Stroke Other   . Coronary artery disease Other      Current outpatient prescriptions:  .  atorvastatin (LIPITOR) 40 MG tablet, Take 40 mg by mouth daily.,  Disp: , Rfl:  .  cetirizine (ZYRTEC) 10 MG tablet, Take 10 mg by mouth daily., Disp: , Rfl:  .  cholecalciferol (VITAMIN D) 1000 UNITS tablet, Take 1,000 Units by mouth daily., Disp: , Rfl:  .  fluticasone (FLONASE) 50 MCG/ACT nasal spray, Place 2 sprays into the nose daily., Disp: , Rfl:  .  glimepiride (AMARYL) 1 MG tablet, Take 1 mg by mouth daily before breakfast., Disp: , Rfl:  .  metFORMIN (GLUCOPHAGE-XR) 500 MG 24 hr tablet, Take 500 mg by mouth daily with breakfast., Disp: , Rfl:  .  vitamin C (ASCORBIC ACID) 500 MG tablet, Take 500 mg by mouth daily., Disp: , Rfl:  .  acetaminophen (TYLENOL) 500 MG tablet, Take 500 mg by mouth every 6 (six) hours as needed., Disp: , Rfl:  .  ciprofloxacin (CIPRO) 500 MG tablet, Take 1 tablet (500 mg total) by mouth 2 (two) times daily. (Patient not  taking: Reported on 09/06/2015), Disp: 14 tablet, Rfl: 0 .  conjugated estrogens (PREMARIN) vaginal cream, Place 0.5 Applicatorfuls vaginally daily. 1 gram nightly, Disp: 42.5 g, Rfl: 12 .  docusate sodium (COLACE) 100 MG capsule, Take 1 capsule (100 mg total) by mouth 2 (two) times daily. Continue this while taking narcotics to help with bowel movements (Patient not taking: Reported on 09/06/2015), Disp: 60 capsule, Rfl: 1 .  niacin 500 MG tablet, Take 500 mg by mouth at bedtime., Disp: , Rfl:  .  ondansetron (ZOFRAN) 4 MG tablet, Take 1 tablet (4 mg total) by mouth every 8 (eight) hours as needed for nausea. (Patient not taking: Reported on 09/06/2015), Disp: 60 tablet, Rfl: 0 .  oxyCODONE (ROXICODONE) 5 MG immediate release tablet, Take 2 tablets (10 mg total) by mouth every 4 (four) hours as needed for severe pain. (Patient not taking: Reported on 09/06/2015), Disp: 60 tablet, Rfl: 0 .  sulfamethoxazole-trimethoprim (BACTRIM DS) 800-160 MG per tablet, Take 1 tablet by mouth 2 (two) times daily. (Patient not taking: Reported on 09/06/2015), Disp: 14 tablet, Rfl: 0 .  sulfamethoxazole-trimethoprim (BACTRIM DS) 800-160 MG per tablet, Take 1 tablet by mouth 2 (two) times daily. (Patient not taking: Reported on 09/06/2015), Disp: 14 tablet, Rfl: 0 .  terconazole (TERAZOL 7) 0.4 % vaginal cream, Place 1 applicator vaginally at bedtime., Disp: 45 g, Rfl: 0  Review of Systems  Review of Systems  Constitutional: Negative for fever, chills, weight loss, malaise/fatigue and diaphoresis.  HENT: Negative for hearing loss, ear pain, nosebleeds, congestion, sore throat, neck pain, tinnitus and ear discharge.   Eyes: Negative for blurred vision, double vision, photophobia, pain, discharge and redness.  Respiratory: Negative for cough, hemoptysis, sputum production, shortness of breath, wheezing and stridor.   Cardiovascular: Negative for chest pain, palpitations, orthopnea, claudication, leg swelling and PND.   Gastrointestinal: negative for abdominal pain. Negative for heartburn, nausea, vomiting, diarrhea, constipation, blood in stool and melena.  Genitourinary: Negative for dysuria, urgency, frequency, hematuria and flank pain.  Musculoskeletal: Negative for myalgias, back pain, joint pain and falls.  Skin: Negative for itching and rash.  Neurological: Negative for dizziness, tingling, tremors, sensory change, speech change, focal weakness, seizures, loss of consciousness, weakness and headaches.  Endo/Heme/Allergies: Negative for environmental allergies and polydipsia. Does not bruise/bleed easily.  Psychiatric/Behavioral: Negative for depression, suicidal ideas, hallucinations, memory loss and substance abuse. The patient is not nervous/anxious and does not have insomnia.        Objective:  Blood pressure 130/80, pulse 92, height  (1.727 m), weight 159  lb (72.122 kg).   Physical Exam  Vitals reviewed. Constitutional: She is oriented to person, place, and time. She appears well-developed and well-nourished.  HENT:  Head: Normocephalic and atraumatic.        Right Ear: External ear normal.  Left Ear: External ear normal.  Nose: Nose normal.  Mouth/Throat: Oropharynx is clear and moist.  Eyes: Conjunctivae and EOM are normal. Pupils are equal, round, and reactive to light. Right eye exhibits no discharge. Left eye exhibits no discharge. No scleral icterus.  Neck: Normal range of motion. Neck supple. No tracheal deviation present. No thyromegaly present.  Cardiovascular: Normal rate, regular rhythm, normal heart sounds and intact distal pulses.  Exam reveals no gallop and no friction rub.   No murmur heard. Respiratory: Effort normal and breath sounds normal. No respiratory distress. She has no wheezes. She has no rales. She exhibits no tenderness.  GI: Soft. Bowel sounds are normal. She exhibits no distension and no mass. There is no tenderness. There is no rebound and no guarding.   Genitourinary:  Breasts no masses skin changes or nipple changes bilaterally      Vulva is normal without lesions Vagina consistent with atrophy and she also has yeast vaginitis , grade 4 cystocele with vaginal vault apex prolapse  Cervix surgically absent  Uterus surgically absent Adnexa surgically absent {Rectal    hemoccult negative, normal tone, no masses  Musculoskeletal: Normal range of motion. She exhibits no edema and no tenderness.  Neurological: She is alert and oriented to person, place, and time. She has normal reflexes. She displays normal reflexes. No cranial nerve deficit. She exhibits normal muscle tone. Coordination normal.  Skin: Skin is warm and dry. No rash noted. No erythema. No pallor.  Psychiatric: She has a normal mood and affect. Her behavior is normal. Judgment and thought content normal.       Assessment:    Healthy female exam.    Plan:    Hormone replacement therapy: Vaginal Premarin cream. Mammogram ordered. Follow up in: 1 year.    Terazol 7  Patient is encouraged to get a mammogram for screening

## 2016-03-02 ENCOUNTER — Telehealth: Payer: Self-pay | Admitting: Obstetrics & Gynecology

## 2016-03-26 ENCOUNTER — Telehealth: Payer: Self-pay | Admitting: Obstetrics & Gynecology

## 2016-03-27 NOTE — Telephone Encounter (Signed)
The patient has not been seen since then, I will get the notes together for you to send

## 2016-09-24 ENCOUNTER — Encounter: Payer: Self-pay | Admitting: Sports Medicine

## 2016-09-24 ENCOUNTER — Ambulatory Visit (INDEPENDENT_AMBULATORY_CARE_PROVIDER_SITE_OTHER): Payer: Medicaid Other | Admitting: Sports Medicine

## 2016-09-24 DIAGNOSIS — S43015A Anterior dislocation of left humerus, initial encounter: Secondary | ICD-10-CM | POA: Insufficient documentation

## 2016-09-24 DIAGNOSIS — Q796 Ehlers-Danlos syndrome, unspecified: Secondary | ICD-10-CM | POA: Insufficient documentation

## 2016-09-24 MED ORDER — NORTRIPTYLINE HCL 25 MG PO CAPS: 25.0000 mg | ORAL_CAPSULE | Freq: Every day | ORAL | 3 refills | Status: DC

## 2016-09-24 NOTE — Assessment & Plan Note (Signed)
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

## 2016-09-24 NOTE — Patient Instructions (Signed)
Start daily exercise program for shoulders.  Stretching exercises on sheet given for both shoulders - do not do behind back stretch  R shoulder only - strengthening exercises - pec muscle butterfly - internal rotation  Come back in 1-2 months for hip exercises

## 2016-09-24 NOTE — Assessment & Plan Note (Addendum)
Discussed diagnosis and complications in depth with patient Shoulder HEP as above F/u in 1-2 months for hip HEP

## 2016-09-24 NOTE — Progress Notes (Signed)
  Christina Aguirre - 60 y.o. female MRN [id number]  Date of birth: Sep 16, 1956  SUBJECTIVE:  Including CC & ROS.  Patient presents for recurrent L shoulder dislocation.  Patient reports spontaneous L shoulder anterior dislocation in 05/2016, which husband was able to reduce.  L shoulder dislocated spontaneously again in 06/2016 and was able to be self-reduced.  Patient denies any history of previous shoulder dislocations, but her knees and ankles dislocate regularly since childhood.  She has had anterior shoulder tenderness and burning pain since dislocations.  She is most concerned about decreased ROM (especially abduction and internal rotation) since dislocation.  Pain is worse at night when rolling over in bed.  She feels like R shoulder is now "stretching out" as well.  Exercise regimen includes tai chi.  HISTORY: Past Medical, Surgical, Social, and Family History Reviewed & Updated per EMR.   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 Also has T2DM and HLD. Previous fracture of L 2nd metacarpal s/p internal fixation. S/p 2 bladder suspensions for pelvic organ prolapse.  Also has Mitral valve prolapse. PSHx -  Never smoker. unemployed FHx -  Daughter diagnosed with EDS. Mother likely with EDS with increased flexibility and frequent knee and wrist dislocations.  Sister with vascular type EDS Medications - metformin, amaryl  ROS Remote syncope/ none recently Occ dizziness with standing Jaws pop  DATA REVIEWED: Records from PCP with history as above  PHYSICAL EXAM:  VS: BP:(!) 144/93  HR:98bpm  TEMP: ( )  RESP:   HT:'5\' 7"'$  (170.2 cm)   WT:155 lb (70.3 kg)  BMI:24.3 PHYSICAL EXAM: Gen: NAD, alert, cooperative with exam, well-appearing HEENT: clear conjunctiva, EOMI CV:  no edema, capillary refill brisk,  Resp: non-labored, normal speech Skin: no rashes, 4cm elevation with pinch test  Neuro: no gross deficits.  Psych:  alert and  oriented  MSK: increased neck ROM, except for limitation in R side lateral bend.  R shoulder with +sulcus sign. R Pinon subluxation anteriorly.  L shoulder with abduction limited to 90 deg, flexion limited to 110 deg, internal rotation limited compared to R shoulder.  Hips with 90 deg of total rotation.  L spine with 45 deg extension,  R and L knes with increased flexion and no hyperextension.  Ankles with increased mobility in anterior drawer and on inversion and PF.  Subluxation of 2nd and 5th MCPs of R hand. Increased flexion of b/l wrists.  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  Note reviewed and modified by me. Ila Mcgill, MD

## 2016-12-02 ENCOUNTER — Ambulatory Visit: Payer: Medicaid Other | Admitting: Sports Medicine

## 2016-12-24 ENCOUNTER — Encounter: Payer: Self-pay | Admitting: Sports Medicine

## 2016-12-24 ENCOUNTER — Ambulatory Visit (INDEPENDENT_AMBULATORY_CARE_PROVIDER_SITE_OTHER): Payer: Medicaid Other | Admitting: Sports Medicine

## 2016-12-24 ENCOUNTER — Ambulatory Visit: Payer: Self-pay

## 2016-12-24 VITALS — BP 126/78 | Ht 67.0 in | Wt 155.0 lb

## 2016-12-24 DIAGNOSIS — M7582 Other shoulder lesions, left shoulder: Secondary | ICD-10-CM

## 2016-12-24 DIAGNOSIS — S43015D Anterior dislocation of left humerus, subsequent encounter: Secondary | ICD-10-CM | POA: Diagnosis not present

## 2016-12-24 DIAGNOSIS — Q796 Ehlers-Danlos syndrome, unspecified: Secondary | ICD-10-CM

## 2016-12-24 DIAGNOSIS — S43015A Anterior dislocation of left humerus, initial encounter: Secondary | ICD-10-CM

## 2016-12-24 DIAGNOSIS — S43002D Unspecified subluxation of left shoulder joint, subsequent encounter: Secondary | ICD-10-CM | POA: Diagnosis present

## 2016-12-24 MED ORDER — GABAPENTIN 100 MG PO CAPS: 100.0000 mg | ORAL_CAPSULE | Freq: Every day | ORAL | 1 refills | Status: DC

## 2016-12-24 MED ORDER — NITROGLYCERIN 0.2 MG/HR TD PT24: MEDICATED_PATCH | TRANSDERMAL | 1 refills | Status: DC

## 2016-12-24 NOTE — Progress Notes (Signed)
Christina Aguirre - 61 y.o. female MRN [id number]  Date of birth: 1956-02-13  SUBJECTIVE:  Including CC & ROS.  CC: left shoulder pain, chronic joint pains  Presents for follow-up of left shoulder pain after dislocation or subluxation of her left shoulder joint. Patient has a history of Ehlers-Danlos syndrome.  She reports improvement in her apprehension about the shoulder coming out of joint. She has not seen a significant decrease in pain yet. She had tried the nortriptyline but had nausea side effects with it. She is wondering if there is anything else she could try. She has not done well with amitriptyline.  She is still having decreased range of motion but has been working with the range of motion exercises. She still enjoys tai chi. Denies any numbness or tingling down her arm.   ROS: No unexpected weight loss, fever, chills, swelling, instability, muscle pain, numbness/tingling, redness, otherwise see HPI   PMHx - Updated and reviewed.  Contributory factors include: Ehlers-Danlos PSHx - Updated and reviewed.  Contributory factors include:  Negative FHx - Updated and reviewed.  Contributory factors include:  Negative Social Hx - Updated and reviewed. Contributory factors include: Negative Medications - reviewed   DATA REVIEWED: Previous office visits  PHYSICAL EXAM:  VS: BP:126/78  HR: bpm  TEMP: ( )  RESP:   HT:5' 7"  (170.2 cm)   WT:155 lb (70.3 kg)  BMI:24.3 PHYSICAL EXAM: Gen: NAD, alert, cooperative with exam, well-appearing HEENT: clear conjunctiva,  CV:  no edema, capillary refill brisk, normal rate Resp: non-labored Skin: no rashes, normal turgor  Neuro: no gross deficits.  Psych:  alert and oriented  Shoulder: Inspection reveals no abnormalities, atrophy or asymmetry. Palpation is normal with no tenderness over AC joint or bicipital groove. ROM is decreased in flexion to 160, abduction to 90, extension and internal rotation to L1 on left side, full ROM on right  side. Rotator cuff strength normal throughout. Positive signs of impingement with positive Neer and Hawkin's tests, empty can sign. No labral pathology noted with negative Obrien's, negative clunk and good stability. Normal scapular function observed. + painful arc and no drop arm sign.  Imaging: [place] image of the L shoulder in both long and short axis obtained. Biceps tendon appears normal fibrillar pattern without surrounding effusion. Subscapularis appears normal without obvious tears or abnormalities. The supraspinatus tendon appears to have a small cortical partial tear with cortical changes.  The infraspinatus and teres minor tendons appear normal with no tears and a good footprints. The subdeltoid/subacromial bursa is unremarkable and shows no impingement with dynamic motion. The [facility] joint appears to have ample joint space and no spurring or effusion is present. Peripheral posterior labrum without obvious tears.  Findings consistent with rotator cuff tendinitis.    ASSESSMENT & PLAN:   Subluxation of left shoulder joint Subluxation of the shoulder joint causing irritation of the rotator cuff. Patient has been doing range of motion exercises with minimal decrease in pain. Would like to begin strengthening exercises. Gave handout for strengthening exercises.  Ehlers-Danlos syndrome This is causing her to have subluxation of joints. For her chronic pain, she did not tolerate nortriptyline or amitriptyline. We'll start her on a low dose of gabapentin to see if this helps. Can gradually titrate up. For now she will only take it at night. Reviewed side effects.  Rotator cuff tendinitis, left Has rotator cuff tendinitis, seen on ultrasound. Will try nitroglycerin patches. She'll follow-up in 4 weeks. Gave rotator cuff strengthening exercises.

## 2016-12-24 NOTE — Patient Instructions (Signed)
Nitroglycerin Protocol  Apply 1/4 nitroglycerin patch to affected area daily. Change position of patch within the affected area every 24 hours. You may experience a headache during the first 1-2 weeks of using the patch, these should subside. If you experience headaches after beginning nitroglycerin patch treatment, you may take your preferred over the counter pain reliever. Another side effect of the nitroglycerin patch is skin irritation or rash related to patch adhesive. Please notify our office if you develop more severe headaches or rash, and stop the patch. Tendon healing with nitroglycerin patch may require 12 to 24 weeks depending on the extent of injury. Men should not use if taking Viagra, Cialis, or Levitra.  Do not use if you have migraines or rosacea.     Follow up in 4 weeks 

## 2016-12-25 DIAGNOSIS — M7582 Other shoulder lesions, left shoulder: Secondary | ICD-10-CM | POA: Insufficient documentation

## 2016-12-25 DIAGNOSIS — S43002A Unspecified subluxation of left shoulder joint, initial encounter: Secondary | ICD-10-CM | POA: Insufficient documentation

## 2016-12-25 NOTE — Assessment & Plan Note (Signed)
Has rotator cuff tendinitis, seen on ultrasound. Will try nitroglycerin patches. She'll follow-up in 4 weeks. Gave rotator cuff strengthening exercises.

## 2016-12-25 NOTE — Assessment & Plan Note (Signed)
Subluxation of the shoulder joint causing irritation of the rotator cuff. Patient has been doing range of motion exercises with minimal decrease in pain. Would like to begin strengthening exercises. Gave handout for strengthening exercises.

## 2016-12-25 NOTE — Assessment & Plan Note (Signed)
This is causing her to have subluxation of joints. For her chronic pain, she did not tolerate nortriptyline or amitriptyline. We'll start her on a low dose of gabapentin to see if this helps. Can gradually titrate up. For now she will only take it at night. Reviewed side effects.

## 2017-01-21 ENCOUNTER — Encounter: Payer: Self-pay | Admitting: Sports Medicine

## 2017-01-21 ENCOUNTER — Ambulatory Visit (INDEPENDENT_AMBULATORY_CARE_PROVIDER_SITE_OTHER): Payer: Medicaid Other | Admitting: Sports Medicine

## 2017-01-21 DIAGNOSIS — M7582 Other shoulder lesions, left shoulder: Secondary | ICD-10-CM | POA: Diagnosis not present

## 2017-01-21 DIAGNOSIS — M545 Low back pain, unspecified: Secondary | ICD-10-CM | POA: Insufficient documentation

## 2017-01-21 MED ORDER — MELOXICAM 15 MG PO TABS: 15.0000 mg | ORAL_TABLET | Freq: Every day | ORAL | 2 refills | Status: DC

## 2017-01-21 MED ORDER — GABAPENTIN 300 MG PO CAPS: 300.0000 mg | ORAL_CAPSULE | Freq: Every day | ORAL | 2 refills | Status: DC

## 2017-01-21 NOTE — Progress Notes (Signed)
  Christina Aguirre - 61 y.o. female MRN [id number]  Date of birth: 10/11/1956  SUBJECTIVE:  Including CC & ROS.  CC: Left shoulder pain and low back pain  Christina Aguirre presents in follow-up for left shoulder pain and low back pain. She is well in terms of her left shoulder has been doing the exercises. She feels that the gabapentin is helping. It helps her sleep at night and helps with her daily pain. Regards to her low back, she has had a history of a herniated disc at L3-L4 and L4-L5. This is been treated conservatively with physical therapy and medications in the past. She is feeling it flareup again.  She would like exercises to do for this.  She reports that it radiates that her right hip. No pain down her leg. No numbness or tingling. No weakness of her lower extremities.    ROS: No unexpected weight loss, fever, chills, swelling, instability, muscle pain, numbness/tingling, redness, otherwise see HPI   PMHx - Updated and reviewed.  Contributory factors include: Ehlers-Danlos PSHx - Updated and reviewed.  Contributory factors include:  Negative FHx - Updated and reviewed.  Contributory factors include:  Negative Social Hx - Updated and reviewed. Contributory factors include: Negative Medications - reviewed   DATA REVIEWED: Previous office visits  PHYSICAL EXAM:  VS: BP:(!) 153/94  HR:97bpm  TEMP: ( )  RESP:   HT:5\' 8"  (172.7 cm)   WT:152 lb (68.9 kg)  BMI:23.2 PHYSICAL EXAM: Gen: NAD, alert, cooperative with exam, well-appearing HEENT: clear conjunctiva,  CV:  no edema, capillary refill brisk, normal rate Resp: non-labored Skin: no rashes, normal turgor  Neuro: no gross deficits.  Psych:  alert and oriented  Shoulder: Inspection reveals no abnormalities, atrophy or asymmetry. Palpation is normal with no tenderness over AC joint or bicipital groove. ROM is decreased in abduction, but improved, 160 degrees abduction and full flexion. Rotator cuff strength normal throughout. +  signs of impingement with + Neer and Hawkin's tests, empty can sign. Speeds and Yergason's tests normal. No labral pathology noted with negative Obrien's, negative clunk and good stability. Normal scapular function observed. +painful arc and no drop arm sign.   Back Exam:  Inspection: Unremarkable  Palpable tenderness: +lumbar paraspinals. Range of Motion:  Full ROM  Leg strength: Quad: 5/5 Hamstring: 5/5 Hip flexor: 5/5 Hip abductors: 5/5  Strength at foot: Plantar-flexion: 5/5 Dorsi-flexion: 5/5 Eversion: 5/5 Inversion: 5/5  Sensory change: Gross sensation intact to all lumbar and sacral dermatomes.  Reflexes: 2+ at both patellar tendons, 2+ at achilles tendons, Babinski's downgoing.  Gait unremarkable. SLR laying: Negative  XSLR laying: Negative     ASSESSMENT & PLAN:   Low back pain We'll treat conservatively with the home exercise program. She is given Mobic to help with inflammation and gabapentin to help with nerve pain. Follow-up in 4 weeks.  Rotator cuff tendinitis, left She will continue with the gabapentin and home exercises. She is doing well. Follow-up 4 weeks.

## 2017-01-21 NOTE — Patient Instructions (Signed)
Gabapentin 2 tablets 100mg  at night for 5 days, then 1 tablet of 300mg  gabapentin at night for 1 month

## 2017-01-21 NOTE — Assessment & Plan Note (Addendum)
She will continue with the gabapentin and home exercises. She is doing well. Follow-up 4 weeks.

## 2017-01-21 NOTE — Assessment & Plan Note (Addendum)
We'll treat conservatively with the home exercise program. She is given Mobic to help with inflammation and gabapentin to help with nerve pain. Follow-up in 4 weeks.

## 2017-02-25 ENCOUNTER — Ambulatory Visit (INDEPENDENT_AMBULATORY_CARE_PROVIDER_SITE_OTHER): Payer: Medicaid Other | Admitting: Sports Medicine

## 2017-02-25 ENCOUNTER — Encounter: Payer: Self-pay | Admitting: Sports Medicine

## 2017-02-25 DIAGNOSIS — M7582 Other shoulder lesions, left shoulder: Secondary | ICD-10-CM

## 2017-02-25 DIAGNOSIS — Q796 Ehlers-Danlos syndrome, unspecified: Secondary | ICD-10-CM

## 2017-02-25 MED ORDER — GABAPENTIN 300 MG PO CAPS: 600.0000 mg | ORAL_CAPSULE | Freq: Every day | ORAL | 2 refills | Status: DC

## 2017-02-25 NOTE — Progress Notes (Signed)
[facility] Progress Note  Subjective:  Christina Aguirre is a 61 y.o. female with hypermobility 2/2 suspected Ehler's-Danlos syndrome who presents for follow-up of L rotator cuff tendinitis. She has been using nitroglycerin over her shoulder as recommended, due to small cortical partial tear of supraspinatus tendon noted on [place] 12/24/16. She has various other complaints, reporting concern that her R ankle is "loosening up." She continues to have sensation of "electric shocks" down her left arm and into her 2nd and 3rd fingers that she thinks has increased in frequency. She is taking gabapentin 300 mg QHS but thinks it mostly just makes her drowsy. (Could not tolerate amitriptyline or nortriptyline due to nausea). These shocks happen when she sleeps on her L shoulder. She is wearing a c-collar to rest her neck when relaxing at home and sleeps on a cervical pillow at night. She reports that her hips are "okay" and that she has been doing her back exercises and enjoying tai chi. She says her R knee has been hurting but thinks this is 2/2 her R ankle being loose.   Of note, patient has various clinical findings aside from her hypermobility since childhood to support diagnosis of Ehler's-Danlos syndrome, including history of bladder and uterine prolapse and dysautonomia with hand color changes. Her daughter was formally diagnosed by Dr. Darrol Angel at [facility]. Patient's sister has had complications of brain aneurysms.   ROS: No falls, no rashes  Objective: Blood pressure (!) 154/94, pulse 81, height _0  (1.727 m), weight 152 lb (68.9 kg). Body mass index is 23.11 kg/m. Constitutional: Pleasant female, in NAD  Pulmonary/Chest: No respiratory distress.  Musculoskeletal: FROM at neck. R ankle with negative posterior or anterior drawer testing, stable eversion and inversion (to 60 degrees) but increased flexibility noted. R patella with increased laxity but stable. No TTP over anterior  of posterior shoulders but increased tension in R shoulder.  Neurological: No sensory deficits at time of exam. 5-/5 strength with shoulder abduction on L (limited by pain). Today she is stronger on empty can and on hawkins Skin: Skin is warm and dry. No rash noted. Nitro patch noted medial to L AC joint.  Psychiatric: Normal mood and affect.  Vitals reviewed  Assessment/Plan: Rotator cuff tendinitis, left - Pain improved. - Continue nitro patch but place closer to front edge of shoulder.  Ehlers-Danlos syndrome - For cervical radiculopathy from suspected subluxation, wear cervical collar more during day to give ligaments and muscles supporting head a rest and do isometric exercises. Also increase gabapentin to 600 mg before bed. Could consider adding cymbalta if minimal improvement.  - For right ankle laxity, incorporate one-footed balance exercises.  - For knee, do leg raises and lateral hip rotations.  Follow-up 6-8 weeks for rescan of L rotator cuff tear by [place].  Olene Floss, MD [place], PGY-2

## 2017-02-25 NOTE — Assessment & Plan Note (Addendum)
-   Pain improved. - Continue nitro patch but place closer to front edge of shoulder.

## 2017-02-25 NOTE — Patient Instructions (Signed)
Christina Aguirre,  Please increase gabapentin to 600 mg before bed.  Try to incorporate one-foot balance exercises to strengthen your R ankle.  Place nitro patch closer to front edge of your shoulder.  Wear cervical collar more during day to give your head a rest and do isometric exercises.  It is fine to take a motrin instead of meloxicam at times when headache is bad.  For knee, do leg raises and lateral hip rotations.

## 2017-02-25 NOTE — Assessment & Plan Note (Addendum)
-   For cervical radiculopathy from suspected subluxation, wear cervical collar more during day to give ligaments and muscles supporting head a rest and do isometric exercises. Also increase gabapentin to 600 mg before bed. Could consider adding cymbalta if minimal improvement.  - For right ankle laxity, incorporate one-footed balance exercises.  - For knee, do leg raises and lateral hip rotations.

## 2017-04-08 ENCOUNTER — Ambulatory Visit (INDEPENDENT_AMBULATORY_CARE_PROVIDER_SITE_OTHER): Payer: Medicaid Other | Admitting: Sports Medicine

## 2017-04-08 ENCOUNTER — Ambulatory Visit: Payer: Self-pay

## 2017-04-08 VITALS — BP 130/85 | Ht 67.75 in | Wt 156.0 lb

## 2017-04-08 DIAGNOSIS — M7582 Other shoulder lesions, left shoulder: Secondary | ICD-10-CM

## 2017-04-08 DIAGNOSIS — S43015D Anterior dislocation of left humerus, subsequent encounter: Secondary | ICD-10-CM

## 2017-04-08 NOTE — Assessment & Plan Note (Signed)
Continue home exercise program Can discontinue nitroglycerin patches of this time, as she has much improvement. Follow-up as needed

## 2017-04-08 NOTE — Progress Notes (Signed)
  Christina Aguirre - 61 y.o. female MRN [id number]  Date of birth: 06/13/1956  SUBJECTIVE:  Including CC & ROS.  CC: left shoulder follow up   Presents with left shoulder pain follow-up after subluxating several times.  She was first seen in February for this and diagnosed with rotator cuff tendinitis.  She was tried on gabapentin to see if this helped and also nitroglycerin patches. Gabapentin made her too sleepy and she self stop the nitroglycerin patches because she became somewhat better between her last visit and this visit. She continues to deny any numbness or tingling. It only hurts when she is chasing after her new puppy and reaching backwards. Overhead activity doesn't seem to bother her anymore.  She also reports that her ankle is doing better.  She is doing her exercises and has noted improvement.    ROS: No unexpected weight loss, fever, chills, swelling, instability, muscle pain, numbness/tingling, redness, otherwise see HPI   PMHx - Updated and reviewed.  Contributory factors include: Ehlers-Danlos SHx - Updated and reviewed.  Contributory factors include:  Negative FHx - Updated and reviewed.  Contributory factors include:  Negative Social Hx - Updated and reviewed. Contributory factors include: Negative Medications - reviewed   DATA REVIEWED: Previous office visits and u/s  PHYSICAL EXAM:  VS: BP:130/85  HR: bpm  TEMP: ( )  RESP:   HT:5' 7.75" (172.1 cm)   WT:156 lb (70.8 kg)  BMI:23.9 PHYSICAL EXAM: Gen: NAD, alert, cooperative with exam, well-appearing HEENT: clear conjunctiva,  CV:  no edema, capillary refill brisk, normal rate Resp: non-labored Skin: no rashes, normal turgor  Neuro: no gross deficits.  Psych:  alert and oriented  Shoulder: Inspection reveals no abnormalities, atrophy or asymmetry. Palpation is normal with no tenderness over AC joint or bicipital groove. ROM is full in all planes. Rotator cuff strength normal throughout. + signs of  impingement with + Neer and Hawkin's tests, empty can sign. Speeds and Yergason's tests normal. No labral pathology noted with negative Obrien's, negative clunk and good stability. Normal scapular function observed. No painful arc and no drop arm sign. No apprehension sign  Ultrasound of left shoulder:  Biceps tendon appears normal fibrillar pattern without surrounding effusion. Subscapularis appears normal without obvious tears or abnormalities. The supraspinatus tendon appears to have no hypo or hyperechoic changes throughout and has good fibrillary pattern, still has a cortical defect, but tendon appears intact.  The infraspinatus and teres minor tendons appear normal with no tears and a good footprints.   Ultrasound findings consistent with resolving rotator cuff tendinitis  Ultrasound performed and interpreted by Christina PeachAlicia Taytem Ghattas, DO     ASSESSMENT & PLAN:   Rotator cuff tendinitis, left Continue home exercise program Can discontinue nitroglycerin patches of this time, as she has much improvement. Follow-up as needed

## 2017-04-28 ENCOUNTER — Other Ambulatory Visit: Payer: Self-pay | Admitting: Sports Medicine

## 2019-01-24 ENCOUNTER — Ambulatory Visit (HOSPITAL_COMMUNITY)
Admission: RE | Admit: 2019-01-24 | Discharge: 2019-01-24 | Disposition: A | Discharge status: Home / Self Care | Payer: Self-pay | Source: Ambulatory Visit | Attending: Nurse Practitioner | Admitting: Nurse Practitioner

## 2019-01-24 ENCOUNTER — Other Ambulatory Visit (HOSPITAL_COMMUNITY): Payer: Self-pay | Admitting: Nurse Practitioner

## 2019-01-24 DIAGNOSIS — S99911A Unspecified injury of right ankle, initial encounter: Secondary | ICD-10-CM

## 2019-01-25 ENCOUNTER — Encounter: Payer: Self-pay | Admitting: Orthopaedic Surgery

## 2019-01-25 ENCOUNTER — Other Ambulatory Visit: Payer: Self-pay

## 2019-01-25 ENCOUNTER — Ambulatory Visit: Payer: Self-pay | Admitting: Orthopaedic Surgery

## 2019-01-25 VITALS — BP 106/67 | HR 107 | Ht 68.0 in

## 2019-01-25 DIAGNOSIS — Q796 Ehlers-Danlos syndrome, unspecified: Secondary | ICD-10-CM

## 2019-01-25 DIAGNOSIS — S8264XA Nondisplaced fracture of lateral malleolus of right fibula, initial encounter for closed fracture: Secondary | ICD-10-CM

## 2019-01-25 MED ORDER — HYDROCODONE-ACETAMINOPHEN 5-325 MG PO TABS: ORAL_TABLET | ORAL | 0 refills | Status: DC

## 2019-01-25 NOTE — Progress Notes (Signed)
Subjective:    Patient ID: Christina Aguirre, female    DOB: 09/29/1956, 63 y.o.   MRN: [id number]  HPI She injured her right ankle at church 01-22-2019 showing the youth group a cheer for cheerleading.  Her ankle gave way and she fell and hurt her ankle.  She has Ehlers-Danlos syndrome and thus loose ligaments.  She went home and elevated her foot.  She had more pain.  She had x-rays done yesterday, 01-24-2019 showing: IMPRESSION: Nondisplaced, non comminuted fracture of the distal fibula. Normally aligned ankle joint.  She has no cast or brace.  She has no other injury.  She has taken ibuprofen for pain but still has pain.  I will give several days of narcotics.  She is fitted for a CAM walker and care of the ankle including instructions for contrast baths given.   Review of Systems  Constitutional: Positive for activity change.  Musculoskeletal: Positive for gait problem and joint swelling.  All other systems reviewed and are negative.  For Review of Systems, all other systems reviewed and are negative.  The following is a summary of the past history medically, past history surgically, known current medicines, social history and family history.  This information is gathered electronically by the computer from prior information and documentation.  I review this each visit and have found including this information at this point in the chart is beneficial and informative.   Past Medical History:  Diagnosis Date  . Anemia   . Arthritis   . Degenerative disc disease   . Diabetes mellitus   . Ehler's-Danlos syndrome   . History of bronchitis   . Mitral valve prolapse    echo 20 yr ago  . PONV (postoperative nausea and vomiting)   . Self-catheterizes urinary bladder    2 times a day as needed    Past Surgical History:  Procedure Laterality Date  . ABDOMINAL HYSTERECTOMY    . CYSTOCELE REPAIR  08/24/2012   Procedure: ANTERIOR REPAIR (CYSTOCELE);  Surgeon: Lazaro ArmsLuther H Eure, MD;   Location: [facility];  Service: Gynecology;  Laterality: N/A;  ANTERIOR REPAIR WITH GRAFT  . CYSTOSCOPY  08/24/2012   Procedure: CYSTOSCOPY;  Surgeon: Lazaro ArmsLuther H Eure, MD;  Location: [facility];  Service: Gynecology;  Laterality: N/A;  . DIAGNOSTIC LAPAROSCOPY    . DILATION AND CURETTAGE OF UTERUS     miscarriages  . OPEN REDUCTION INTERNAL FIXATION (ORIF) METACARPAL Left 02/01/2014   Procedure: LEFT RING FINGER FRACTURE OPEN TREATMENT METACARPAL SINGLE INCLUDES INTERNAL FIXATION EACH BONE;  Surgeon: Sheral Apleyimothy D Murphy, MD;  Location: [facility];  Service: Orthopedics;  Laterality: Left;  . PUBOVAGINAL SLING  08/24/2012   Procedure: Leonides GrillsPUBO-VAGINAL SLING;  Surgeon: Lazaro ArmsLuther H Eure, MD;  Location: [facility];  Service: Gynecology;  Laterality: N/A;  . TONSILLECTOMY    . VAGINAL HYSTERECTOMY  08/24/2012   Procedure: HYSTERECTOMY VAGINAL;  Surgeon: Lazaro ArmsLuther H Eure, MD;  Location: [facility];  Service: Gynecology;  Laterality: N/A;  . VAGINAL PROLAPSE REPAIR  08/24/2012   Procedure: VAGINAL VAULT SUSPENSION;  Surgeon: Lazaro ArmsLuther H Eure, MD;  Location: [facility];  Service: Gynecology;  Laterality: N/A;    Current Outpatient Medications on File Prior to Visit  Medication Sig Dispense Refill  . acetaminophen (TYLENOL) 500 MG tablet Take 500 mg by mouth every 6 (six) hours as needed.    Marland Kitchen. atorvastatin (LIPITOR) 40 MG tablet Take 40 mg by mouth daily.    . cholecalciferol (VITAMIN D) 1000 UNITS tablet  Take 1,000 Units by mouth daily.    Marland Kitchen conjugated estrogens (PREMARIN) vaginal cream Place 0.5 Applicatorfuls vaginally daily. 1 gram nightly 42.5 g 12  . Cranberry 500 MG CAPS Take 4,200 mg by mouth.    . diazepam (VALIUM) 5 MG tablet Insert 1 tab per rectum every 8 hrs for pelvic floor spasm    . docusate sodium (COLACE) 100 MG capsule Take 1 capsule (100 mg total) by mouth 2 (two) times daily. Continue this while taking narcotics to help with bowel movements 60 capsule 1  . fluticasone (FLONASE) 50 MCG/ACT nasal spray Place  2 sprays into the nose daily.    Marland Kitchen gabapentin (NEURONTIN) 300 MG capsule Take 2 capsules (600 mg total) by mouth at bedtime. 30 capsule 2  . glimepiride (AMARYL) 1 MG tablet Take 1 mg by mouth daily before breakfast.    . meloxicam (MOBIC) 15 MG tablet TAKE 1 TABLET BY MOUTH ONCE DAILY 30 tablet 2  . metFORMIN (GLUCOPHAGE-XR) 500 MG 24 hr tablet Take 500 mg by mouth daily with breakfast.    . nitroGLYCERIN (NITRODUR - DOSED IN MG/24 HR) 0.2 mg/hr patch Place 1/4 patch to affected area daily 30 patch 1   No current facility-administered medications on file prior to visit.     Social History   Socioeconomic History  . Marital status: Married    Spouse name: Not on file  . Number of children: Not on file  . Years of education: Not on file  . Highest education level: Not on file  Occupational History  . Not on file  Social Needs  . Financial resource strain: Not on file  . Food insecurity:    Worry: Not on file    Inability: Not on file  . Transportation needs:    Medical: Not on file    Non-medical: Not on file  Tobacco Use  . Smoking status: Never Smoker  . Smokeless tobacco: Never Used  Substance and Sexual Activity  . Alcohol use: Yes    Alcohol/week: 1.0 standard drinks    Types: 1 Glasses of wine per week    Comment: wine  . Drug use: No  . Sexual activity: Yes    Birth control/protection: Post-menopausal  Lifestyle  . Physical activity:    Days per week: Not on file    Minutes per session: Not on file  . Stress: Not on file  Relationships  . Social connections:    Talks on phone: Not on file    Gets together: Not on file    Attends religious service: Not on file    Active member of club or organization: Not on file    Attends meetings of clubs or organizations: Not on file    Relationship status: Not on file  . Intimate partner violence:    Fear of current or ex partner: Not on file    Emotionally abused: Not on file    Physically abused: Not on file     Forced sexual activity: Not on file  Other Topics Concern  . Not on file  Social History Narrative  . Not on file    Family History  Problem Relation Age of Onset  . Cancer Mother        colon   . Hypertension Other   . Diabetes Other   . Stroke Other   . Coronary artery disease Other     BP 106/67   Pulse (!) 107   Ht 5\' 8"  (1.727 m)  BMI 23.72 kg/m   Body mass index is 23.72 kg/m.      Objective:   Physical Exam Constitutional:      Appearance: She is well-developed.  HENT:     Head: Normocephalic and atraumatic.  Eyes:     Conjunctiva/sclera: Conjunctivae normal.     Pupils: Pupils are equal, round, and reactive to light.  Neck:     Musculoskeletal: Normal range of motion and neck supple.  Cardiovascular:     Rate and Rhythm: Normal rate and regular rhythm.  Pulmonary:     Effort: Pulmonary effort is normal.  Abdominal:     Palpations: Abdomen is soft.  Musculoskeletal:       Feet:  Skin:    General: Skin is warm and dry.  Neurological:     Mental Status: She is alert and oriented to person, place, and time.     Cranial Nerves: No cranial nerve deficit.     Motor: No abnormal muscle tone.     Coordination: Coordination normal.     Deep Tendon Reflexes: Reflexes are normal and symmetric. Reflexes normal.  Psychiatric:        Behavior: Behavior normal.        Thought Content: Thought content normal.        Judgment: Judgment normal.      I have reviewed the x-rays and notes from family doctor.     Assessment & Plan:   Encounter Diagnoses  Name Primary?  . Closed nondisplaced fracture of lateral malleolus of right fibula, initial encounter Yes  . Ehlers-Danlos syndrome    A CAM walker given.  Instructions for contrast baths given.  Return in one week.  X-rays on return.  I have reviewed the [place] Controlled Substance Reporting System web site prior to prescribing narcotic medicine for this patient.   Call if any  problem.  Precautions discussed.   Electronically Signed Darreld Mclean, MD 3/4/20209:48 AM

## 2019-02-01 ENCOUNTER — Encounter: Payer: Self-pay | Admitting: Orthopaedic Surgery

## 2019-02-01 ENCOUNTER — Ambulatory Visit (INDEPENDENT_AMBULATORY_CARE_PROVIDER_SITE_OTHER): Payer: Medicaid Other

## 2019-02-01 ENCOUNTER — Ambulatory Visit: Payer: Self-pay | Admitting: Orthopaedic Surgery

## 2019-02-01 ENCOUNTER — Other Ambulatory Visit: Payer: Self-pay

## 2019-02-01 VITALS — BP 118/78 | HR 90

## 2019-02-01 DIAGNOSIS — Q796 Ehlers-Danlos syndrome, unspecified: Secondary | ICD-10-CM

## 2019-02-01 DIAGNOSIS — S8264XD Nondisplaced fracture of lateral malleolus of right fibula, subsequent encounter for closed fracture with routine healing: Secondary | ICD-10-CM

## 2019-02-01 NOTE — Progress Notes (Signed)
CC:  I have less pain  She has been using the CAM walker and having no problem.  NV intact. ROM is good. She is tender over the lateral malleolus and had resolving ecchymosis present.  X-rays were done of the right ankle, reported separately.  Encounter Diagnoses  Name Primary?  . Closed nondisplaced fracture of lateral malleolus of right fibula with routine healing, subsequent encounter Yes  . Ehlers-Danlos syndrome    Return in three weeks.  X-rays on return.  Continue the CAM walker.  Call if any problem.  Precautions discussed.   Electronically Signed Darreld Mclean, MD 3/11/202010:33 AM

## 2019-02-22 ENCOUNTER — Ambulatory Visit: Payer: Medicaid Other | Admitting: Orthopedic Surgery

## 2019-04-05 ENCOUNTER — Ambulatory Visit: Payer: Self-pay | Admitting: Orthopedic Surgery

## 2019-09-22 ENCOUNTER — Other Ambulatory Visit: Payer: Self-pay

## 2019-09-22 DIAGNOSIS — Z20822 Contact with and (suspected) exposure to covid-19: Secondary | ICD-10-CM

## 2019-09-24 LAB — NOVEL CORONAVIRUS, NAA: SARS-CoV-2, NAA: NOT DETECTED

## 2019-10-31 ENCOUNTER — Other Ambulatory Visit: Payer: Self-pay

## 2019-10-31 DIAGNOSIS — Z20822 Contact with and (suspected) exposure to covid-19: Secondary | ICD-10-CM

## 2019-11-01 LAB — NOVEL CORONAVIRUS, NAA: SARS-CoV-2, NAA: DETECTED — AB

## 2019-11-03 ENCOUNTER — Other Ambulatory Visit: Payer: Self-pay | Admitting: Nurse Practitioner

## 2019-11-03 ENCOUNTER — Telehealth: Payer: Self-pay | Admitting: Nurse Practitioner

## 2019-11-03 NOTE — Telephone Encounter (Signed)
Called to Discuss with patient about Covid symptoms and the use of bamlanivimab, a monoclonal antibody infusion for those with mild to moderate Covid symptoms and at a high risk of hospitalization.     Pt is qualified for this infusion at the [facility] infusion center due to co-morbid conditions and/or a member of an at-risk group.     Patient declines infusion at this time.

## 2019-11-19 ENCOUNTER — Other Ambulatory Visit: Payer: Self-pay

## 2019-11-19 ENCOUNTER — Ambulatory Visit
Admission: EM | Admit: 2019-11-19 | Discharge: 2019-11-19 | Disposition: A | Discharge status: Home / Self Care | Payer: Medicaid Other | Attending: Emergency Medicine | Admitting: Emergency Medicine

## 2019-11-19 ENCOUNTER — Ambulatory Visit (INDEPENDENT_AMBULATORY_CARE_PROVIDER_SITE_OTHER): Payer: Self-pay

## 2019-11-19 DIAGNOSIS — R05 Cough: Secondary | ICD-10-CM

## 2019-11-19 DIAGNOSIS — U071 COVID-19: Secondary | ICD-10-CM

## 2019-11-19 DIAGNOSIS — R053 Chronic cough: Secondary | ICD-10-CM

## 2019-11-19 DIAGNOSIS — R0602 Shortness of breath: Secondary | ICD-10-CM

## 2019-11-19 MED ORDER — BENZONATATE 100 MG PO CAPS: 100.0000 mg | ORAL_CAPSULE | Freq: Three times a day (TID) | ORAL | 0 refills | Status: DC

## 2019-11-19 MED ORDER — ALBUTEROL SULFATE HFA 108 (90 BASE) MCG/ACT IN AERS: 1.0000 | INHALATION_SPRAY | Freq: Four times a day (QID) | RESPIRATORY_TRACT | 0 refills | Status: AC | PRN

## 2019-11-19 MED ORDER — PREDNISONE 10 MG (21) PO TBPK: ORAL_TABLET | Freq: Every day | ORAL | 0 refills | Status: DC

## 2019-11-19 NOTE — ED Triage Notes (Signed)
Pt presents with c/o worsened cough after testing positive for covid on 12/9. Pt states she has had increased sob as well

## 2019-11-19 NOTE — ED Provider Notes (Signed)
[facility]   [id number] 11/19/19 Arrival Time: 6295   CC: COVID infection; worsening cough  SUBJECTIVE: History from: patient.  Christina Aguirre is a 63 y.o. female who presents with worsening mildly productive cough with white sputum, chest tightness, and SOB with exertion x 1 day.  Tested positive for COVID on 12/9. Has appt with PCP in January.  Has tried OTC medications as well as albuterol inhaler and nebulizer treatment without relief.  Symptoms are made worse with activity.  Reports previous symptoms in the past related to bronchitis and treated with prednisone with relief.   Denies fever, chills, fatigue, sinus pain, rhinorrhea, sore throat, wheezing, chest pain, nausea, changes in bowel or bladder habits.     ROS: As per HPI.  All other pertinent ROS negative.     Past Medical History:  Diagnosis Date  . Anemia   . Arthritis   . Degenerative disc disease   . Diabetes mellitus   . Ehler's-Danlos syndrome   . History of bronchitis   . Mitral valve prolapse    echo 20 yr ago  . PONV (postoperative nausea and vomiting)   . Self-catheterizes urinary bladder    2 times a day as needed   Past Surgical History:  Procedure Laterality Date  . ABDOMINAL HYSTERECTOMY    . CYSTOCELE REPAIR  08/24/2012   Procedure: ANTERIOR REPAIR (CYSTOCELE);  Surgeon: Florian Buff, MD;  Location: [facility];  Service: Gynecology;  Laterality: N/A;  ANTERIOR REPAIR WITH GRAFT  . CYSTOSCOPY  08/24/2012   Procedure: CYSTOSCOPY;  Surgeon: Florian Buff, MD;  Location: [facility];  Service: Gynecology;  Laterality: N/A;  . DIAGNOSTIC LAPAROSCOPY    . DILATION AND CURETTAGE OF UTERUS     miscarriages  . OPEN REDUCTION INTERNAL FIXATION (ORIF) METACARPAL Left 02/01/2014   Procedure: LEFT RING FINGER FRACTURE OPEN TREATMENT METACARPAL SINGLE INCLUDES INTERNAL FIXATION EACH BONE;  Surgeon: Renette Butters, MD;  Location: [facility];  Service: Orthopedics;  Laterality: Left;  .  PUBOVAGINAL SLING  08/24/2012   Procedure: Gaynelle Arabian;  Surgeon: Florian Buff, MD;  Location: [facility];  Service: Gynecology;  Laterality: N/A;  . TONSILLECTOMY    . VAGINAL HYSTERECTOMY  08/24/2012   Procedure: HYSTERECTOMY VAGINAL;  Surgeon: Florian Buff, MD;  Location: [facility];  Service: Gynecology;  Laterality: N/A;  . VAGINAL PROLAPSE REPAIR  08/24/2012   Procedure: VAGINAL VAULT SUSPENSION;  Surgeon: Florian Buff, MD;  Location: [facility];  Service: Gynecology;  Laterality: N/A;   Allergies  Allergen Reactions  . Bee Venom Swelling  . Latex Hives  . Penicillins     Respiratory distress    . Vancomycin Hives and Itching  . Doxycycline Swelling and Rash   No current facility-administered medications on file prior to encounter.   Current Outpatient Medications on File Prior to Encounter  Medication Sig Dispense Refill  . acetaminophen (TYLENOL) 500 MG tablet Take 500 mg by mouth every 6 (six) hours as needed.    Marland Kitchen atorvastatin (LIPITOR) 40 MG tablet Take 40 mg by mouth daily.    . cholecalciferol (VITAMIN D) 1000 UNITS tablet Take 1,000 Units by mouth daily.    . Cranberry 500 MG CAPS Take 4,200 mg by mouth.    . diazepam (VALIUM) 5 MG tablet Insert 1 tab per rectum every 8 hrs for pelvic floor spasm    . docusate sodium (COLACE) 100 MG capsule Take 1 capsule (100 mg total) by mouth 2 (  two) times daily. Continue this while taking narcotics to help with bowel movements 60 capsule 1  . fluticasone (FLONASE) 50 MCG/ACT nasal spray Place 2 sprays into the nose daily.    . gabapentin (NEURONTIN) 300 MG capsule Take 2 capMarland Kitchensules (600 mg total) by mouth at bedtime. 30 capsule 2  . glimepiride (AMARYL) 1 MG tablet Take 1 mg by mouth daily before breakfast.    . metFORMIN (GLUCOPHAGE-XR) 500 MG 24 hr tablet Take 500 mg by mouth daily with breakfast.    . nitroGLYCERIN (NITRODUR - DOSED IN MG/24 HR) 0.2 mg/hr patch Place 1/4 patch to affected area daily 30 patch 1   Social History    Socioeconomic History  . Marital status: Married    Spouse name: Not on file  . Number of children: Not on file  . Years of education: Not on file  . Highest education level: Not on file  Occupational History  . Not on file  Tobacco Use  . Smoking status: Never Smoker  . Smokeless tobacco: Never Used  Substance and Sexual Activity  . Alcohol use: Yes    Alcohol/week: 1.0 standard drinks    Types: 1 Glasses of wine per week    Comment: wine  . Drug use: No  . Sexual activity: Yes    Birth control/protection: Post-menopausal  Other Topics Concern  . Not on file  Social History Narrative  . Not on file   Social Determinants of Health   Financial Resource Strain:   . Difficulty of Paying Living Expenses: Not on file  Food Insecurity:   . Worried About Programme researcher, broadcasting/film/videounning Out of Food in the Last Year: Not on file  . Ran Out of Food in the Last Year: Not on file  Transportation Needs:   . Lack of Transportation (Medical): Not on file  . Lack of Transportation (Non-Medical): Not on file  Physical Activity:   . Days of Exercise per Week: Not on file  . Minutes of Exercise per Session: Not on file  Stress:   . Feeling of Stress : Not on file  Social Connections:   . Frequency of Communication with Friends and Family: Not on file  . Frequency of Social Gatherings with Friends and Family: Not on file  . Attends Religious Services: Not on file  . Active Member of Clubs or Organizations: Not on file  . Attends BankerClub or Organization Meetings: Not on file  . Marital Status: Not on file  Intimate Partner Violence:   . Fear of Current or Ex-Partner: Not on file  . Emotionally Abused: Not on file  . Physically Abused: Not on file  . Sexually Abused: Not on file   Family History  Problem Relation Age of Onset  . Cancer Mother        colon   . Hypertension Other   . Diabetes Other   . Stroke Other   . Coronary artery disease Other     OBJECTIVE:  Vitals:   11/19/19 1010  BP:  133/79  Pulse: (!) 104  Resp: (!) 22  Temp: 98.8 F (37.1 C)  SpO2: 94%     General appearance: alert; appears fatigued, but nontoxic; speaking in full sentences and tolerating own secretions HEENT: NCAT; Ears: EACs clear, TMs pearly gray; Eyes: PERRL.  EOM grossly intact. Nose: nares patent without rhinorrhea, Throat: oropharynx clear, tonsils non erythematous or enlarged, uvula midline  Neck: supple without LAD Lungs: mild labored respirations, symmetrical air entry; cough: persistent dry cough; no  respiratory distress; CTAB Heart: regular rate and rhythm.   Skin: warm and dry Psychological: alert and cooperative; normal mood and affect  DX STUDIES:  DG Chest 2 View  Result Date: 11/19/2019 CLINICAL DATA:  COVID-19 positive.  Worsening cough. EXAM: CHEST - 2 VIEW COMPARISON:  None. FINDINGS: The heart size and mediastinal contours are within normal limits. Both lungs are clear. The visualized skeletal structures are unremarkable. IMPRESSION: No active cardiopulmonary disease. Electronically Signed   By: Sherian Rein M.D.   On: 11/19/2019 10:24    X-rays negative for cardiopulmonary disease  I have reviewed the x-rays myself and the radiologist interpretation. I am in agreement with the radiologist interpretation.      ASSESSMENT & PLAN:  1. COVID-19 virus infection   2. Persistent cough   3. Shortness of breath     Meds ordered this encounter  Medications  . benzonatate (TESSALON) 100 MG capsule    Sig: Take 1 capsule (100 mg total) by mouth every 8 (eight) hours.    Dispense:  21 capsule    Refill:  0    Order Specific Question:   Supervising Provider    Answer:   Eustace Moore [[id number]]  . albuterol (VENTOLIN HFA) 108 (90 Base) MCG/ACT inhaler    Sig: Inhale 1-2 puffs into the lungs every 6 (six) hours as needed for wheezing or shortness of breath.    Dispense:  18 g    Refill:  0    Order Specific Question:   Supervising Provider    Answer:   Eustace Moore [[id number]]  . predniSONE (STERAPRED UNI-PAK 21 TAB) 10 MG (21) TBPK tablet    Sig: Take by mouth daily. Take 6 tabs by mouth daily  for 2 days, then 5 tabs for 2 days, then 4 tabs for 2 days, then 3 tabs for 2 days, 2 tabs for 2 days, then 1 tab by mouth daily for 2 days    Dispense:  42 tablet    Refill:  0    Order Specific Question:   Supervising Provider    Answer:   Eustace Moore [[id number]]    Chest x-ray negative for cardiopulmonary disease Symptoms most likely secondary to COVID 19 infection Get plenty of rest and push fluids Tessalon Perles prescribed for cough Albuterol inhaler prescribed.  Use as needed for shortness of breath Prednisone taper prescribed as well Use OTC zyrtec for nasal congestion, runny nose, and/or sore throat Use OTC flonase for nasal congestion and runny nose Use medications daily for symptom relief Use OTC medications like ibuprofen or tylenol as needed fever or pain Make an appt with PCP for further evaluation and management Call or go to the ED if you have any new or worsening symptoms such as fever, worsening cough, shortness of breath, chest tightness, chest pain, turning blue, changes in mental status, etc...    Reviewed expectations re: course of current medical issues. Questions answered. Outlined signs and symptoms indicating need for more acute intervention. Patient verbalized understanding. After Visit Summary given.         Rennis Harding, PA-C 11/19/19 1050

## 2019-11-19 NOTE — Discharge Instructions (Signed)
Chest x-ray negative for cardiopulmonary disease Symptoms most likely secondary to COVID 19 infection Get plenty of rest and push fluids Tessalon Perles prescribed for cough Albuterol inhaler prescribed.  Use as needed for shortness of breaht Use OTC zyrtec for nasal congestion, runny nose, and/or sore throat Use OTC flonase for nasal congestion and runny nose Use medications daily for symptom relief Use OTC medications like ibuprofen or tylenol as needed fever or pain Make an appt with PCP for further evaluation and management Call or go to the ED if you have any new or worsening symptoms such as fever, worsening cough, shortness of breath, chest tightness, chest pain, turning blue, changes in mental status, etc..Marland Kitchen

## 2020-07-26 DIAGNOSIS — E559 Vitamin D deficiency, unspecified: Secondary | ICD-10-CM | POA: Diagnosis not present

## 2020-07-26 DIAGNOSIS — Z0189 Encounter for other specified special examinations: Secondary | ICD-10-CM | POA: Diagnosis not present

## 2020-07-26 DIAGNOSIS — J302 Other seasonal allergic rhinitis: Secondary | ICD-10-CM | POA: Diagnosis not present

## 2020-07-26 DIAGNOSIS — M13832 Other specified arthritis, left wrist: Secondary | ICD-10-CM | POA: Diagnosis not present

## 2020-07-26 DIAGNOSIS — Q7962 Hypermobile Ehlers-Danlos syndrome: Secondary | ICD-10-CM | POA: Diagnosis not present

## 2020-07-26 DIAGNOSIS — I10 Essential (primary) hypertension: Secondary | ICD-10-CM | POA: Diagnosis not present

## 2020-07-26 DIAGNOSIS — E782 Mixed hyperlipidemia: Secondary | ICD-10-CM | POA: Diagnosis not present

## 2020-08-01 ENCOUNTER — Encounter: Payer: Self-pay | Admitting: *Deleted

## 2020-09-25 ENCOUNTER — Ambulatory Visit (INDEPENDENT_AMBULATORY_CARE_PROVIDER_SITE_OTHER): Payer: Self-pay | Admitting: *Deleted

## 2020-09-25 ENCOUNTER — Other Ambulatory Visit: Payer: Self-pay

## 2020-09-25 VITALS — Ht 68.5 in | Wt 156.0 lb

## 2020-09-25 DIAGNOSIS — Z86010 Personal history of colonic polyps: Secondary | ICD-10-CM

## 2020-09-25 NOTE — Patient Instructions (Addendum)
[place]   Please notify us immediately if you are diabetic, take iron supplements, or if you are on coumadin or any blood thinners.   Patient Name: Christina Aguirre Date of procedure: 10/23/2020 Time to register at [facility]: 12:00 pm Provider: Dr. Gala Romney   Purchase: MIRALAX 238 gram bottle, 1 FLEET ENEMA, 1 box of DULCOLAX (All over the counter medications)    10/21/2020/ Monday- 2 Days prior to procedure: START CLEAR LIQUID DIET AFTER YOUR LUNCH MEAL--NO SOLID FOODS!   10/22/2020/ Tuesday- 1 Day prior to procedure:   CLEAR LIQUIDS ALL DAY--NO SOLID FOODS!   Diabetic medication adjustments for today:    At 10:00 AM, take 2 DULCOLAX 59m tablets   At 12:00 PM, Mix 5 teaspoons of Miralax in any 4-6 ounces of CLEAR LIQUIDS (Gatorade) every hour for 5 hours until passing clear, watery stools. Be sure to drink 4 ounces of clear liquid 30 minutes after each dose of Miralax.   At 3:00 PM, take 2 Dulcolax 590mtablets   If stools are not clear & watery by 6:00 PM, take 5 teaspoons of Miralax every 30 minutes until stools are clear (no color)   You must have a complete prep to ensure the most effective cleaning.   Make a conscious effort to drink as much as you can before, during & after the preparation.    10/23/2020/Wednesday-Do not take medications except for your heart, blood pressure & breathing medications. You may take them with a sip of clear liquids.    10/23/2020/ Wednesday- Day of Procedure  Clear liquids until 3 hours before your procedure.  Nothing by mouth after 10:00 am. Give yourself one Fleet enema about 1 hour prior to leaving for the hospital.   Diabetic medication adjustments for today:   You may take TYLENOL products. Please continue your regular medications unless we have instructed you otherwise.    Please note, on the day of your procedure you MUST be accompanied by an adult who is willing to assume responsibility for  you at time of discharge. If you do not have such person with you, your procedure will have to be rescheduled.                                                             Please leave ALL jewelry at home prior to coming to the hospital for your procedure.   *It is your responsibility to check with your insurance company for the benefits of coverage you have for this procedure. Unfortunately, not all insurance companies have benefits to cover all or part of these types of procedures. It is your responsibility to check your benefits, however we will be glad to assist you with any codes your insurance company may need.   Please note that most insurance companies will not cover a screening colonoscopy for people under the age of 5042For example, with some insurance companies you may have benefits for a screening colonoscopy, but if polyps are found the diagnosis will change and then you may have a deductible that will need to be met. Please make sure you check your benefits for screening colonoscopy as well as a diagnostic colonoscopy.    CLEAR LIQUIDS: (NO RED)  Jello Apple Juice White Grape Juice Water  Banana popsicles Kool-Aid Coffee(No cream or milk)  Tea (No cream or milk) Soft drinks Broth (fat free beef/chicken/vegetable)   Clear liquids allow you to see your fingers on the other side of the glass. Be sure they are NOT RED in color, cloudy, but CLEAR.   Do Not Eat:  Dairy products of any kind Cranberry juice  Tomato or V8 Juice Orange Juice  Grapefruit Juice Red Grape Juice  Solid foods like cereal, oatmeal, yogurt, fruits, vegetables, creamed soups, eggs, bread, etc   HELPFUL HINTS TO MAKE DRINKING EASIER:  -Make sure prep is extremely COLD. Refrigerate the night before. You may also put in freezer.  -You may try mixing Crystal Light or Country Time Lemonade if you prefer. MIx in small amounts. Add more if necessary.   -Trying drinking through a straw.  -Rinse mouth with water or mouthwash between glasses to remove aftertaste.  -Try sipping on a cold beverage/ice popsicles between glasses of prep.  -Place a piece of sugar-free hard candy in mouth between glasses.  -If you become nauseated, try consuming smaller amounts or stretch out the time between glasses. Stop for 30 minutes to an hour & slowly start back drinking.   Call our office with any questions or concerns at [phone].   Thank You

## 2020-09-25 NOTE — Progress Notes (Addendum)
Gastroenterology Pre-Procedure Review  Request Date: 09/25/2020 Requesting Physician: Dr. Dwana Melena, Last TCS done 06/30/2013 by Dr. Cristy Folks, tubular adenoma, internal hemorrhoids, family hx colon cancer (mother)  PATIENT REVIEW QUESTIONS: The patient responded to the following health history questions as indicated:    1. Diabetes Melitis: yes, type II 2. Joint replacements in the past 12 months: no 3. Major health problems in the past 3 months: no 4. Has an artificial valve or MVP: yes, MVP 40 years ago 5. Has a defibrillator: no 6. Has been advised in past to take antibiotics in advance of a procedure like teeth cleaning: yes 7. Family history of colon cancer: yes, mother: age 48  8. Alcohol Use: yes, occasional glass of wine 9. Illicit drug Use: no 10. History of sleep apnea: no  11. History of coronary artery or other vascular stents placed within the last 12 months: no 12. History of any prior anesthesia complications: yes, hard to wake and n/v 13. Body mass index is 23.37 kg/m.    MEDICATIONS & ALLERGIES:    Patient reports the following regarding taking any blood thinners:   Plavix? no Aspirin? no Coumadin? no Brilinta? no Xarelto? no Eliquis? no Pradaxa? no Savaysa? no Effient? no  Patient confirms/reports the following medications:  Current Outpatient Medications  Medication Sig Dispense Refill  . acetaminophen (TYLENOL) 500 MG tablet Take 500 mg by mouth as needed.     Marland Kitchen albuterol (VENTOLIN HFA) 108 (90 Base) MCG/ACT inhaler Inhale 1-2 puffs into the lungs every 6 (six) hours as needed for wheezing or shortness of breath. (Patient taking differently: Inhale 1-2 puffs into the lungs as needed for wheezing or shortness of breath. ) 18 g 0  . Ascorbic Acid (VITAMIN C) 500 MG CAPS Take by mouth in the morning and at bedtime.    Marland Kitchen atorvastatin (LIPITOR) 40 MG tablet Take 80 mg by mouth daily.     . cetirizine (ZYRTEC) 10 MG tablet Take 10 mg by mouth daily.    .  cholecalciferol (VITAMIN D) 1000 UNITS tablet Take 1,000 Units by mouth daily.    . COLLAGEN PO Take by mouth. Takes 3 grams daily.    . fluticasone (FLONASE) 50 MCG/ACT nasal spray Place 2 sprays into the nose daily.    Marland Kitchen gabapentin (NEURONTIN) 300 MG capsule Take 2 capsules (600 mg total) by mouth at bedtime. (Patient taking differently: Take 600 mg by mouth as needed. ) 30 capsule 2  . glimepiride (AMARYL) 1 MG tablet Take 1 mg by mouth daily before breakfast.    . ibuprofen (ADVIL) 200 MG tablet Take 200 mg by mouth as needed. Takes 2 tablets as needed.    Marland Kitchen lisinopril (ZESTRIL) 10 MG tablet Take 10 mg by mouth daily.    . meloxicam (MOBIC) 15 MG tablet Take 15 mg by mouth daily.    . metFORMIN (GLUCOPHAGE-XR) 500 MG 24 hr tablet Take 500 mg by mouth. Takes with dinner in the evening.    . montelukast (SINGULAIR) 10 MG tablet Take 10 mg by mouth at bedtime.    . nitroGLYCERIN (NITRODUR - DOSED IN MG/24 HR) 0.2 mg/hr patch Place 1/4 patch to affected area daily 30 patch 1  . vitamin B-12 (CYANOCOBALAMIN) 1000 MCG tablet Take 1,000 mcg by mouth daily.     No current facility-administered medications for this visit.    Patient confirms/reports the following allergies:  Allergies  Allergen Reactions  . Bee Venom Swelling  . Latex Hives  . Penicillins  Respiratory distress    . Vancomycin Hives and Itching  . Doxycycline Swelling and Rash    No orders of the defined types were placed in this encounter.   AUTHORIZATION INFORMATION Primary Insurance: [employer],  ID #: [id number]  Pre-Cert / Berkley Harvey required: No, not required  Secondary Insurance: Medicaid Family Planning  ID#: [id number] Pre-Cert / Berkley Harvey required: No, not required  SCHEDULE INFORMATION: Procedure has been scheduled as follows:  Date: 10/23/2020, Time: 1:00 Location: [facility] with Dr. Jena Gauss  This Gastroenterology Pre-Precedure Review Form is being routed to the following provider(s): Tana Coast, PA

## 2020-09-30 ENCOUNTER — Encounter: Payer: Self-pay | Admitting: *Deleted

## 2020-09-30 ENCOUNTER — Other Ambulatory Visit: Payer: Self-pay | Admitting: *Deleted

## 2020-09-30 NOTE — Progress Notes (Addendum)
ASAII. Day of prep: amaryl 1/2 dose, metformin hold evening dose AM of TCS: hold amaryl and metformin

## 2020-09-30 NOTE — Progress Notes (Signed)
Spoke to pt.  She said that she has not seen a cardiologist in a very long time.  Informed her that no antibiotics were needed per guidelines.  Informed her that I would be mailing out new prep instructions and diabetes medication adjustment letter.

## 2020-09-30 NOTE — Progress Notes (Signed)
Noted  

## 2020-09-30 NOTE — Progress Notes (Addendum)
Pt wants to know if we are going to be prescribing antibiotics before her procedure due to MVP.

## 2020-09-30 NOTE — Progress Notes (Addendum)
No need for antibiotics for MVP prior to colonoscopy per guidelines.  Does she still see a cardiologist for her MVP. If so, can we get copy of recent OV note or ECHO.  If none available, please let me know.

## 2020-09-30 NOTE — Progress Notes (Signed)
Noted. Proceed as schedule.

## 2020-10-07 DIAGNOSIS — Z712 Person consulting for explanation of examination or test findings: Secondary | ICD-10-CM | POA: Diagnosis not present

## 2020-10-10 DIAGNOSIS — J302 Other seasonal allergic rhinitis: Secondary | ICD-10-CM | POA: Diagnosis not present

## 2020-10-10 DIAGNOSIS — E559 Vitamin D deficiency, unspecified: Secondary | ICD-10-CM | POA: Diagnosis not present

## 2020-10-10 DIAGNOSIS — I10 Essential (primary) hypertension: Secondary | ICD-10-CM | POA: Diagnosis not present

## 2020-10-10 DIAGNOSIS — Q7962 Hypermobile Ehlers-Danlos syndrome: Secondary | ICD-10-CM | POA: Diagnosis not present

## 2020-10-10 DIAGNOSIS — M13832 Other specified arthritis, left wrist: Secondary | ICD-10-CM | POA: Diagnosis not present

## 2020-10-10 DIAGNOSIS — E782 Mixed hyperlipidemia: Secondary | ICD-10-CM | POA: Diagnosis not present

## 2020-10-21 ENCOUNTER — Other Ambulatory Visit (HOSPITAL_COMMUNITY)
Admission: RE | Admit: 2020-10-21 | Discharge: 2020-10-21 | Disposition: A | Discharge status: Home / Self Care | Payer: PPO | Source: Ambulatory Visit | Attending: Internal Medicine | Admitting: Internal Medicine

## 2020-10-21 ENCOUNTER — Other Ambulatory Visit: Payer: Self-pay

## 2020-10-21 DIAGNOSIS — Z20822 Contact with and (suspected) exposure to covid-19: Secondary | ICD-10-CM | POA: Diagnosis not present

## 2020-10-21 DIAGNOSIS — Z01818 Encounter for other preprocedural examination: Secondary | ICD-10-CM | POA: Diagnosis not present

## 2020-10-21 DIAGNOSIS — Z1231 Encounter for screening mammogram for malignant neoplasm of breast: Secondary | ICD-10-CM | POA: Diagnosis not present

## 2020-10-22 LAB — SARS CORONAVIRUS 2 (TAT 6-24 HRS): SARS Coronavirus 2: NEGATIVE

## 2020-10-23 ENCOUNTER — Encounter (HOSPITAL_COMMUNITY): Payer: Self-pay | Admitting: Internal Medicine

## 2020-10-23 ENCOUNTER — Other Ambulatory Visit: Payer: Self-pay

## 2020-10-23 ENCOUNTER — Ambulatory Visit (HOSPITAL_COMMUNITY)
Admission: RE | Admit: 2020-10-23 | Discharge: 2020-10-23 | Disposition: A | Discharge status: Home / Self Care | Payer: PPO | Attending: Internal Medicine | Admitting: Internal Medicine

## 2020-10-23 ENCOUNTER — Encounter (HOSPITAL_COMMUNITY)
Admission: RE | Disposition: A | Discharge status: Home / Self Care | Payer: Self-pay | Source: Home / Self Care | Attending: Internal Medicine

## 2020-10-23 DIAGNOSIS — Z880 Allergy status to penicillin: Secondary | ICD-10-CM | POA: Insufficient documentation

## 2020-10-23 DIAGNOSIS — Z7984 Long term (current) use of oral hypoglycemic drugs: Secondary | ICD-10-CM | POA: Diagnosis not present

## 2020-10-23 DIAGNOSIS — Z86010 Personal history of colonic polyps: Secondary | ICD-10-CM

## 2020-10-23 DIAGNOSIS — Z8249 Family history of ischemic heart disease and other diseases of the circulatory system: Secondary | ICD-10-CM | POA: Diagnosis not present

## 2020-10-23 DIAGNOSIS — Z800 Family history of malignant neoplasm of digestive organs: Secondary | ICD-10-CM | POA: Insufficient documentation

## 2020-10-23 DIAGNOSIS — I341 Nonrheumatic mitral (valve) prolapse: Secondary | ICD-10-CM | POA: Diagnosis not present

## 2020-10-23 DIAGNOSIS — Z90710 Acquired absence of both cervix and uterus: Secondary | ICD-10-CM | POA: Insufficient documentation

## 2020-10-23 DIAGNOSIS — Z1211 Encounter for screening for malignant neoplasm of colon: Secondary | ICD-10-CM | POA: Insufficient documentation

## 2020-10-23 DIAGNOSIS — Z881 Allergy status to other antibiotic agents status: Secondary | ICD-10-CM | POA: Diagnosis not present

## 2020-10-23 DIAGNOSIS — Z833 Family history of diabetes mellitus: Secondary | ICD-10-CM | POA: Diagnosis not present

## 2020-10-23 DIAGNOSIS — Z823 Family history of stroke: Secondary | ICD-10-CM | POA: Insufficient documentation

## 2020-10-23 DIAGNOSIS — Z91030 Bee allergy status: Secondary | ICD-10-CM | POA: Diagnosis not present

## 2020-10-23 DIAGNOSIS — Z79899 Other long term (current) drug therapy: Secondary | ICD-10-CM | POA: Insufficient documentation

## 2020-10-23 DIAGNOSIS — E119 Type 2 diabetes mellitus without complications: Secondary | ICD-10-CM | POA: Diagnosis not present

## 2020-10-23 DIAGNOSIS — Z91040 Latex allergy status: Secondary | ICD-10-CM | POA: Diagnosis not present

## 2020-10-23 DIAGNOSIS — Q796 Ehlers-Danlos syndrome, unspecified: Secondary | ICD-10-CM | POA: Insufficient documentation

## 2020-10-23 HISTORY — PX: COLONOSCOPY: SHX5424

## 2020-10-23 LAB — GLUCOSE, CAPILLARY: Glucose-Capillary: 169 mg/dL — ABNORMAL HIGH (ref 70–99)

## 2020-10-23 SURGERY — COLONOSCOPY
Anesthesia: Moderate Sedation

## 2020-10-23 MED ORDER — MIDAZOLAM HCL 5 MG/5ML IJ SOLN
INTRAMUSCULAR | Status: AC
  Filled 2020-10-23: qty 10

## 2020-10-23 MED ORDER — SODIUM CHLORIDE 0.9 % IV SOLN: INTRAVENOUS | Status: DC

## 2020-10-23 MED ORDER — ONDANSETRON HCL 4 MG/2ML IJ SOLN
INTRAMUSCULAR | Status: DC | PRN
  Administered 2020-10-23: 4 mg via INTRAVENOUS

## 2020-10-23 MED ORDER — MIDAZOLAM HCL 5 MG/5ML IJ SOLN
INTRAMUSCULAR | Status: DC | PRN
  Administered 2020-10-23: 2 mg via INTRAVENOUS
  Administered 2020-10-23: 1 mg via INTRAVENOUS
  Administered 2020-10-23: 2 mg via INTRAVENOUS

## 2020-10-23 MED ORDER — MEPERIDINE HCL 50 MG/ML IJ SOLN
INTRAMUSCULAR | Status: AC
  Filled 2020-10-23: qty 1

## 2020-10-23 MED ORDER — ONDANSETRON HCL 4 MG/2ML IJ SOLN
INTRAMUSCULAR | Status: AC
  Filled 2020-10-23: qty 2

## 2020-10-23 MED ORDER — MEPERIDINE HCL 100 MG/ML IJ SOLN
INTRAMUSCULAR | Status: DC | PRN
  Administered 2020-10-23: 15 mg
  Administered 2020-10-23: 25 mg

## 2020-10-23 NOTE — Op Note (Signed)
[facility] Patient Name: Christina Aguirre Procedure Date: 10/23/2020 10:48 AM MRN: [id number] Date of Birth: 16-May-1956 Attending MD: Gennette Pac , MD CSN: [id number] Age: 64 Admit Type: Outpatient Procedure:                Colonoscopy Indications:              High risk colon cancer surveillance: Personal                            history of colonic polyps Providers:                Gennette Pac, MD, Jannett Celestine, RN, Pandora Leiter, Technician Referring MD:              Medicines:                Midazolam 4 mg IV, Meperidine 40 mg IV Complications:            No immediate complications. Estimated Blood Loss:     Estimated blood loss: none. Procedure:                Pre-Anesthesia Assessment:                           - Prior to the procedure, a History and Physical                            was performed, and patient medications and                            allergies were reviewed. The patient's tolerance of                            previous anesthesia was also reviewed. The risks                            and benefits of the procedure and the sedation                            options and risks were discussed with the patient.                            All questions were answered, and informed consent                            was obtained. Prior Anticoagulants: The patient has                            taken no previous anticoagulant or antiplatelet                            agents. ASA Grade Assessment: II - A patient with  mild systemic disease. After reviewing the risks                            and benefits, the patient was deemed in                            satisfactory condition to undergo the procedure.                           After obtaining informed consent, the colonoscope                            was passed under direct vision. Throughout the                            procedure,  the patient's blood pressure, pulse, and                            oxygen saturations were monitored continuously. The                            CF-HQ190L ([id number]) scope was introduced through                            the anus and advanced to the the cecum, identified                            by appendiceal orifice and ileocecal valve. The                            colonoscopy was performed without difficulty. The                            patient tolerated the procedure well. The quality                            of the bowel preparation was adequate. Scope In: 11:05:45 AM Scope Out: 11:17:53 AM Scope Withdrawal Time: 0 hours 7 minutes 11 seconds  Total Procedure Duration: 0 hours 12 minutes 8 seconds  Findings:      The perianal and digital rectal examinations were normal.      The colon (entire examined portion) appeared normal.      The exam was otherwise without abnormality on direct and retroflexion       views. Impression:               - The entire examined colon is normal.                           - The examination was otherwise normal on direct                            and retroflexion views.                           -  No specimens collected. Moderate Sedation:      Moderate (conscious) sedation was administered by the endoscopy nurse       and supervised by the endoscopist. The following parameters were       monitored: oxygen saturation, heart rate, blood pressure, respiratory       rate, EKG, adequacy of pulmonary ventilation, and response to care.       Total physician intraservice time was 20 minutes. Recommendation:           - Patient has a contact number available for                            emergencies. The signs and symptoms of potential                            delayed complications were discussed with the                            patient. Return to normal activities tomorrow.                            Written discharge instructions were  provided to the                            patient.                           - Resume previous diet.                           - Continue present medications.                           - Repeat colonoscopy in 5 years for surveillance.                           - Return to GI office (date not yet determined). Procedure Code(s):        --- Professional ---                           [phone], Colonoscopy, flexible; diagnostic, including                            collection of specimen(s) by brushing or washing,                            when performed (separate procedure)                           G0500, Moderate sedation services provided by the                            same physician or other qualified health care                            professional performing a gastrointestinal  endoscopic service that sedation supports,                            requiring the presence of an independent trained                            observer to assist in the monitoring of the                            patient's level of consciousness and physiological                            status; initial 15 minutes of intra-service time;                            patient age 81 years or older (additional time may                            be reported with [id number], as appropriate) Diagnosis Code(s):        --- Professional ---                           Z86.010, Personal history of colonic polyps CPT copyright 2019 American Medical Association. All rights reserved. The codes documented in this report are preliminary and upon coder review may  be revised to meet current compliance requirements. Gerrit Friendsobert M. Arrington Bencomo, MD Gennette Pacobert Michael Jamile Rekowski, MD 10/23/2020 11:26:41 AM This report has been signed electronically. Number of Addenda: 0

## 2020-10-23 NOTE — H&P (Signed)
@LOGO @   Primary Care Physician:  , MD Primary Gastroenterologist:  Dr. Benita Stabile  Pre-Procedure History & Physical: HPI:  Christina Aguirre is a 64 y.o. female here for surveillance colonoscopy. History of colonic adenoma removed in [facility] 2014. Striking positive family history in patient's mother diagnosed with colon cancer at age 61. Currently, no bowel symptoms.  Past Medical History:  Diagnosis Date  . Anemia   . Arthritis   . Degenerative disc disease   . Diabetes mellitus   . Ehler's-Danlos syndrome   . History of bronchitis   . Mitral valve prolapse    echo 20 yr ago  . PONV (postoperative nausea and vomiting)   . Self-catheterizes urinary bladder    2 times a day as needed    Past Surgical History:  Procedure Laterality Date  . ABDOMINAL HYSTERECTOMY    . CYSTOCELE REPAIR  08/24/2012   Procedure: ANTERIOR REPAIR (CYSTOCELE);  Surgeon: 10/24/2012, MD;  Location: [facility];  Service: Gynecology;  Laterality: N/A;  ANTERIOR REPAIR WITH GRAFT  . CYSTOSCOPY  08/24/2012   Procedure: CYSTOSCOPY;  Surgeon: 10/24/2012, MD;  Location: [facility];  Service: Gynecology;  Laterality: N/A;  . DIAGNOSTIC LAPAROSCOPY    . DILATION AND CURETTAGE OF UTERUS     miscarriages  . OPEN REDUCTION INTERNAL FIXATION (ORIF) METACARPAL Left 02/01/2014   Procedure: LEFT RING FINGER FRACTURE OPEN TREATMENT METACARPAL SINGLE INCLUDES INTERNAL FIXATION EACH BONE;  Surgeon: 04/03/2014, MD;  Location: [facility];  Service: Orthopedics;  Laterality: Left;  . PUBOVAGINAL SLING  08/24/2012   Procedure: 10/24/2012;  Surgeon: Leonides Grills, MD;  Location: [facility];  Service: Gynecology;  Laterality: N/A;  . TONSILLECTOMY    . VAGINAL HYSTERECTOMY  08/24/2012   Procedure: HYSTERECTOMY VAGINAL;  Surgeon: 10/24/2012, MD;  Location: [facility];  Service: Gynecology;  Laterality: N/A;  . VAGINAL PROLAPSE REPAIR  08/24/2012   Procedure: VAGINAL VAULT SUSPENSION;  Surgeon: 10/24/2012,  MD;  Location: [facility];  Service: Gynecology;  Laterality: N/A;    Prior to Admission medications   Medication Sig Start Date End Date Taking? Authorizing Provider  acetaminophen (TYLENOL) 500 MG tablet Take 1,000 mg by mouth daily as needed for mild pain or moderate pain.    Yes [provider]  Ascorbic Acid (VITAMIN C) 500 MG CAPS Take 500 mg by mouth in the morning and at bedtime.    Yes [provider]  atorvastatin (LIPITOR) 80 MG tablet Take 80 mg by mouth at bedtime.    Yes [provider]  cetirizine (ZYRTEC) 10 MG tablet Take 10 mg by mouth daily.   Yes [provider]  cholecalciferol (VITAMIN D) 1000 UNITS tablet Take 1,000 Units by mouth daily.   Yes [provider]  COLLAGEN PO Take 1 g by mouth daily.    Yes [provider]  fluticasone (FLONASE) 50 MCG/ACT nasal spray Place 2 sprays into the nose daily.   Yes [provider]  gabapentin (NEURONTIN) 300 MG capsule Take 2 capsules (600 mg total) by mouth at bedtime. Patient taking differently: Take 600 mg by mouth 2 (two) times daily as needed (Nerve pain).  02/25/17  Yes Fitzgerald, 04/27/17, MD  glimepiride (AMARYL) 1 MG tablet Take 1 mg by mouth daily before breakfast.   Yes [provider]  ibuprofen (ADVIL) 200 MG tablet Take 400 mg by mouth daily as needed.    Yes [provider]  lisinopril (ZESTRIL) 10 MG tablet Take 10 mg by mouth daily.   Yes [provider]  meloxicam (MOBIC) 15 MG tablet Take 15 mg by mouth daily.   Yes [provider]  metFORMIN (GLUCOPHAGE-XR) 500 MG 24 hr tablet Take 500 mg by mouth daily with supper.    Yes [provider]  montelukast (SINGULAIR) 10 MG tablet Take 10 mg by mouth daily.    Yes [provider]  vitamin B-12 (CYANOCOBALAMIN) 1000 MCG tablet Take 1,000 mcg by mouth daily.   Yes [provider]  albuterol (VENTOLIN HFA) 108 (90 Base) MCG/ACT inhaler Inhale 1-2  puffs into the lungs every 6 (six) hours as needed for wheezing or shortness of breath. 11/19/19   [place], [place], PA-C  ketotifen (ALLERGY EYE DROPS) 0.025 % ophthalmic solution Place 1 drop into both eyes daily as needed (allergies).    [provider]  nitroGLYCERIN (NITRODUR - DOSED IN MG/24 HR) 0.2 mg/hr patch Place 1/4 patch to affected area daily Patient taking differently: Place 0.2 mg onto the skin daily as needed (Nerve injury).  12/24/16   Enid Baas, MD    Allergies as of 09/30/2020 - Review Complete 09/25/2020  Allergen Reaction Noted  . Bee venom Swelling 06/17/2016  . Latex Hives 08/09/2012  . Penicillins  08/09/2012  . Vancomycin Hives and Itching 08/09/2012  . Doxycycline Swelling and Rash 08/09/2012    Family History  Problem Relation Age of Onset  . Cancer Mother        colon   . Hypertension Other   . Diabetes Other   . Stroke Other   . Coronary artery disease Other     Social History   Socioeconomic History  . Marital status: Married    Spouse name: Not on file  . Number of children: Not on file  . Years of education: Not on file  . Highest education level: Not on file  Occupational History  . Not on file  Tobacco Use  . Smoking status: Never Smoker  . Smokeless tobacco: Never Used  Substance and Sexual Activity  . Alcohol use: Yes    Alcohol/week: 1.0 standard drink    Types: 1 Glasses of wine per week    Comment: wine  . Drug use: No  . Sexual activity: Yes    Birth control/protection: Post-menopausal  Other Topics Concern  . Not on file  Social History Narrative  . Not on file   Social Determinants of Health   Financial Resource Strain:   . Difficulty of Paying Living Expenses: Not on file  Food Insecurity:   . Worried About Programme researcher, broadcasting/film/video in the Last Year: Not on file  . Ran Out of Food in the Last Year: Not on file  Transportation Needs:   . Lack of Transportation (Medical): Not on file  . Lack of Transportation  (Non-Medical): Not on file  Physical Activity:   . Days of Exercise per Week: Not on file  . Minutes of Exercise per Session: Not on file  Stress:   . Feeling of Stress : Not on file  Social Connections:   . Frequency of Communication with Friends and Family: Not on file  . Frequency of Social Gatherings with Friends and Family: Not on file  . Attends Religious Services: Not on file  . Active Member of Clubs or Organizations: Not on file  . Attends Banker Meetings: Not on file  . Marital Status: Not on  file  Intimate Partner Violence:   . Fear of Current or Ex-Partner: Not on file  . Emotionally Abused: Not on file  . Physically Abused: Not on file  . Sexually Abused: Not on file    Review of Systems: See HPI, otherwise negative ROS  Physical Exam: BP 128/81   Temp 98.2 F (36.8 C) (Oral)   Ht 5\' 8"  (1.727 m)   Wt 70.8 kg   SpO2 99%   BMI 23.72 kg/m  General:   Alert,  Well-developed, well-nourished, pleasant and cooperative in NAD Neck:  Supple; no masses or thyromegaly. No significant cervical adenopathy. Lungs:  Clear throughout to auscultation.   No wheezes, crackles, or rhonchi. No acute distress. Heart:  Regular rate and rhythm; no murmurs, clicks, rubs,  or gallops. Abdomen: Non-distended, normal bowel sounds.  Soft and nontender without appreciable mass or hepatosplenomegaly.  Pulses:  Normal pulses noted. Extremities:  Without clubbing or edema.  Impression/Plan: 64 year old lady with a personal history colonic polyps and a first-degree relative with colon cancer at a young age here for surveillance examination. I will offer the patient a surveillance colonoscopy today per plan.  The risks, benefits, limitations, alternatives and imponderables have been reviewed with the patient. Questions have been answered. All parties are agreeable.      Notice: This dictation was prepared with Dragon dictation along with smaller phrase technology. Any  transcriptional errors that result from this process are unintentional and may not be corrected upon review.

## 2020-10-23 NOTE — Discharge Instructions (Signed)
  Colonoscopy Discharge Instructions  Read the instructions outlined below and refer to this sheet in the next few weeks. These discharge instructions provide you with general information on caring for yourself after you leave the hospital. Your doctor may also give you specific instructions. While your treatment has been planned according to the most current medical practices available, unavoidable complications occasionally occur. If you have any problems or questions after discharge, call Dr. Jena Gauss at [phone]. ACTIVITY  You may resume your regular activity, but move at a slower pace for the next 24 hours.   Take frequent rest periods for the next 24 hours.   Walking will help get rid of the air and reduce the bloated feeling in your belly (abdomen).   No driving for 24 hours (because of the medicine (anesthesia) used during the test).    Do not sign any important legal documents or operate any machinery for 24 hours (because of the anesthesia used during the test).  NUTRITION  Drink plenty of fluids.   You may resume your normal diet as instructed by your doctor.   Begin with a light meal and progress to your normal diet. Heavy or fried foods are harder to digest and may make you feel sick to your stomach (nauseated).   Avoid alcoholic beverages for 24 hours or as instructed.  MEDICATIONS  You may resume your normal medications unless your doctor tells you otherwise.  WHAT YOU CAN EXPECT TODAY  Some feelings of bloating in the abdomen.   Passage of more gas than usual.   Spotting of blood in your stool or on the toilet paper.  IF YOU HAD POLYPS REMOVED DURING THE COLONOSCOPY:  No aspirin products for 7 days or as instructed.   No alcohol for 7 days or as instructed.   Eat a soft diet for the next 24 hours.  FINDING OUT THE RESULTS OF YOUR TEST Not all test results are available during your visit. If your test results are not back during the visit, make an appointment  with your caregiver to find out the results. Do not assume everything is normal if you have not heard from your caregiver or the medical facility. It is important for you to follow up on all of your test results.  SEEK IMMEDIATE MEDICAL ATTENTION IF:  You have more than a spotting of blood in your stool.   Your belly is swollen (abdominal distention).   You are nauseated or vomiting.   You have a temperature over 101.   You have abdominal pain or discomfort that is severe or gets worse throughout the day.    Your colon looked normal today.  No polyps.  Nothing to biopsy or remove.  Recommend you return in 5 years for repeat colonoscopy.  At patient request, I called Christina Aguirre at [phone] discussed findings and recommendations

## 2020-10-25 ENCOUNTER — Other Ambulatory Visit (HOSPITAL_COMMUNITY): Payer: PPO

## 2020-10-28 ENCOUNTER — Encounter (HOSPITAL_COMMUNITY): Payer: Self-pay | Admitting: Internal Medicine

## 2020-12-31 DIAGNOSIS — Q7962 Hypermobile Ehlers-Danlos syndrome: Secondary | ICD-10-CM | POA: Diagnosis not present

## 2020-12-31 DIAGNOSIS — E782 Mixed hyperlipidemia: Secondary | ICD-10-CM | POA: Diagnosis not present

## 2020-12-31 DIAGNOSIS — E1165 Type 2 diabetes mellitus with hyperglycemia: Secondary | ICD-10-CM | POA: Diagnosis not present

## 2020-12-31 DIAGNOSIS — E559 Vitamin D deficiency, unspecified: Secondary | ICD-10-CM | POA: Diagnosis not present

## 2020-12-31 DIAGNOSIS — J302 Other seasonal allergic rhinitis: Secondary | ICD-10-CM | POA: Diagnosis not present

## 2020-12-31 DIAGNOSIS — Z712 Person consulting for explanation of examination or test findings: Secondary | ICD-10-CM | POA: Diagnosis not present

## 2020-12-31 DIAGNOSIS — Z0189 Encounter for other specified special examinations: Secondary | ICD-10-CM | POA: Diagnosis not present

## 2020-12-31 DIAGNOSIS — M13832 Other specified arthritis, left wrist: Secondary | ICD-10-CM | POA: Diagnosis not present

## 2020-12-31 DIAGNOSIS — I10 Essential (primary) hypertension: Secondary | ICD-10-CM | POA: Diagnosis not present

## 2021-01-03 DIAGNOSIS — Z86010 Personal history of colonic polyps: Secondary | ICD-10-CM | POA: Diagnosis not present

## 2021-01-03 DIAGNOSIS — E782 Mixed hyperlipidemia: Secondary | ICD-10-CM | POA: Diagnosis not present

## 2021-01-03 DIAGNOSIS — I10 Essential (primary) hypertension: Secondary | ICD-10-CM | POA: Diagnosis not present

## 2021-01-03 DIAGNOSIS — E559 Vitamin D deficiency, unspecified: Secondary | ICD-10-CM | POA: Diagnosis not present

## 2021-01-03 DIAGNOSIS — M13832 Other specified arthritis, left wrist: Secondary | ICD-10-CM | POA: Diagnosis not present

## 2021-01-03 DIAGNOSIS — J302 Other seasonal allergic rhinitis: Secondary | ICD-10-CM | POA: Diagnosis not present

## 2021-01-03 DIAGNOSIS — Q7962 Hypermobile Ehlers-Danlos syndrome: Secondary | ICD-10-CM | POA: Diagnosis not present

## 2021-01-03 DIAGNOSIS — M25529 Pain in unspecified elbow: Secondary | ICD-10-CM | POA: Diagnosis not present

## 2021-03-31 DIAGNOSIS — I10 Essential (primary) hypertension: Secondary | ICD-10-CM | POA: Diagnosis not present

## 2021-03-31 DIAGNOSIS — Z131 Encounter for screening for diabetes mellitus: Secondary | ICD-10-CM | POA: Diagnosis not present

## 2021-03-31 DIAGNOSIS — E782 Mixed hyperlipidemia: Secondary | ICD-10-CM | POA: Diagnosis not present

## 2021-04-03 DIAGNOSIS — I10 Essential (primary) hypertension: Secondary | ICD-10-CM | POA: Diagnosis not present

## 2021-04-03 DIAGNOSIS — Q7962 Hypermobile Ehlers-Danlos syndrome: Secondary | ICD-10-CM | POA: Diagnosis not present

## 2021-04-03 DIAGNOSIS — Z86010 Personal history of colonic polyps: Secondary | ICD-10-CM | POA: Diagnosis not present

## 2021-04-03 DIAGNOSIS — E782 Mixed hyperlipidemia: Secondary | ICD-10-CM | POA: Diagnosis not present

## 2021-04-03 DIAGNOSIS — E559 Vitamin D deficiency, unspecified: Secondary | ICD-10-CM | POA: Diagnosis not present

## 2021-04-03 DIAGNOSIS — E1142 Type 2 diabetes mellitus with diabetic polyneuropathy: Secondary | ICD-10-CM | POA: Diagnosis not present

## 2021-04-03 DIAGNOSIS — J302 Other seasonal allergic rhinitis: Secondary | ICD-10-CM | POA: Diagnosis not present

## 2021-07-04 DIAGNOSIS — I10 Essential (primary) hypertension: Secondary | ICD-10-CM | POA: Diagnosis not present

## 2021-07-04 DIAGNOSIS — E1142 Type 2 diabetes mellitus with diabetic polyneuropathy: Secondary | ICD-10-CM | POA: Diagnosis not present

## 2021-07-09 ENCOUNTER — Other Ambulatory Visit (HOSPITAL_COMMUNITY): Payer: Self-pay | Admitting: Internal Medicine

## 2021-07-09 DIAGNOSIS — J302 Other seasonal allergic rhinitis: Secondary | ICD-10-CM | POA: Diagnosis not present

## 2021-07-09 DIAGNOSIS — Z86010 Personal history of colonic polyps: Secondary | ICD-10-CM | POA: Diagnosis not present

## 2021-07-09 DIAGNOSIS — I10 Essential (primary) hypertension: Secondary | ICD-10-CM | POA: Diagnosis not present

## 2021-07-09 DIAGNOSIS — Z0001 Encounter for general adult medical examination with abnormal findings: Secondary | ICD-10-CM | POA: Diagnosis not present

## 2021-07-09 DIAGNOSIS — M79671 Pain in right foot: Secondary | ICD-10-CM | POA: Diagnosis not present

## 2021-07-09 DIAGNOSIS — E1142 Type 2 diabetes mellitus with diabetic polyneuropathy: Secondary | ICD-10-CM | POA: Diagnosis not present

## 2021-07-09 DIAGNOSIS — M79672 Pain in left foot: Secondary | ICD-10-CM | POA: Diagnosis not present

## 2021-07-09 DIAGNOSIS — E782 Mixed hyperlipidemia: Secondary | ICD-10-CM | POA: Diagnosis not present

## 2021-07-09 DIAGNOSIS — Q7962 Hypermobile Ehlers-Danlos syndrome: Secondary | ICD-10-CM

## 2021-07-09 DIAGNOSIS — R944 Abnormal results of kidney function studies: Secondary | ICD-10-CM | POA: Diagnosis not present

## 2021-07-09 DIAGNOSIS — E559 Vitamin D deficiency, unspecified: Secondary | ICD-10-CM | POA: Diagnosis not present

## 2021-07-14 ENCOUNTER — Other Ambulatory Visit: Payer: Self-pay

## 2021-07-14 ENCOUNTER — Ambulatory Visit (HOSPITAL_COMMUNITY)
Admission: RE | Admit: 2021-07-14 | Discharge: 2021-07-14 | Disposition: A | Discharge status: Home / Self Care | Payer: PPO | Source: Ambulatory Visit | Attending: Internal Medicine | Admitting: Internal Medicine

## 2021-07-14 DIAGNOSIS — M85852 Other specified disorders of bone density and structure, left thigh: Secondary | ICD-10-CM | POA: Diagnosis not present

## 2021-07-14 DIAGNOSIS — Q7962 Hypermobile Ehlers-Danlos syndrome: Secondary | ICD-10-CM | POA: Insufficient documentation

## 2021-07-14 DIAGNOSIS — Z780 Asymptomatic menopausal state: Secondary | ICD-10-CM | POA: Diagnosis not present

## 2021-09-24 ENCOUNTER — Other Ambulatory Visit (HOSPITAL_COMMUNITY): Payer: Self-pay | Admitting: Internal Medicine

## 2021-09-24 DIAGNOSIS — Z1231 Encounter for screening mammogram for malignant neoplasm of breast: Secondary | ICD-10-CM

## 2021-10-23 ENCOUNTER — Ambulatory Visit (HOSPITAL_COMMUNITY)
Admission: RE | Admit: 2021-10-23 | Discharge: 2021-10-23 | Disposition: A | Discharge status: Home / Self Care | Payer: PPO | Source: Ambulatory Visit | Attending: Internal Medicine | Admitting: Internal Medicine

## 2021-10-23 ENCOUNTER — Other Ambulatory Visit: Payer: Self-pay

## 2021-10-23 DIAGNOSIS — Z1231 Encounter for screening mammogram for malignant neoplasm of breast: Secondary | ICD-10-CM | POA: Diagnosis not present

## 2021-10-27 DIAGNOSIS — Z7984 Long term (current) use of oral hypoglycemic drugs: Secondary | ICD-10-CM | POA: Diagnosis not present

## 2021-10-27 DIAGNOSIS — H2513 Age-related nuclear cataract, bilateral: Secondary | ICD-10-CM | POA: Diagnosis not present

## 2021-10-27 DIAGNOSIS — E119 Type 2 diabetes mellitus without complications: Secondary | ICD-10-CM | POA: Diagnosis not present

## 2021-10-27 DIAGNOSIS — H524 Presbyopia: Secondary | ICD-10-CM | POA: Diagnosis not present

## 2021-10-27 DIAGNOSIS — H52203 Unspecified astigmatism, bilateral: Secondary | ICD-10-CM | POA: Diagnosis not present

## 2021-10-27 DIAGNOSIS — H5211 Myopia, right eye: Secondary | ICD-10-CM | POA: Diagnosis not present

## 2021-10-29 DIAGNOSIS — E1142 Type 2 diabetes mellitus with diabetic polyneuropathy: Secondary | ICD-10-CM | POA: Diagnosis not present

## 2021-10-29 DIAGNOSIS — E782 Mixed hyperlipidemia: Secondary | ICD-10-CM | POA: Diagnosis not present

## 2021-10-31 DIAGNOSIS — Q7962 Hypermobile Ehlers-Danlos syndrome: Secondary | ICD-10-CM | POA: Diagnosis not present

## 2021-10-31 DIAGNOSIS — I10 Essential (primary) hypertension: Secondary | ICD-10-CM | POA: Diagnosis not present

## 2021-10-31 DIAGNOSIS — Z0001 Encounter for general adult medical examination with abnormal findings: Secondary | ICD-10-CM | POA: Diagnosis not present

## 2021-10-31 DIAGNOSIS — M79672 Pain in left foot: Secondary | ICD-10-CM | POA: Diagnosis not present

## 2021-10-31 DIAGNOSIS — Z86010 Personal history of colonic polyps: Secondary | ICD-10-CM | POA: Diagnosis not present

## 2021-10-31 DIAGNOSIS — E1142 Type 2 diabetes mellitus with diabetic polyneuropathy: Secondary | ICD-10-CM | POA: Diagnosis not present

## 2021-10-31 DIAGNOSIS — J302 Other seasonal allergic rhinitis: Secondary | ICD-10-CM | POA: Diagnosis not present

## 2021-10-31 DIAGNOSIS — E782 Mixed hyperlipidemia: Secondary | ICD-10-CM | POA: Diagnosis not present

## 2021-10-31 DIAGNOSIS — E559 Vitamin D deficiency, unspecified: Secondary | ICD-10-CM | POA: Diagnosis not present

## 2021-10-31 DIAGNOSIS — R944 Abnormal results of kidney function studies: Secondary | ICD-10-CM | POA: Diagnosis not present

## 2021-11-03 ENCOUNTER — Other Ambulatory Visit (HOSPITAL_COMMUNITY): Payer: Self-pay | Admitting: Internal Medicine

## 2021-11-03 DIAGNOSIS — Q7962 Hypermobile Ehlers-Danlos syndrome: Secondary | ICD-10-CM

## 2022-03-10 DIAGNOSIS — Q7962 Hypermobile Ehlers-Danlos syndrome: Secondary | ICD-10-CM | POA: Diagnosis not present

## 2022-03-10 DIAGNOSIS — E782 Mixed hyperlipidemia: Secondary | ICD-10-CM | POA: Diagnosis not present

## 2022-03-10 DIAGNOSIS — M79671 Pain in right foot: Secondary | ICD-10-CM | POA: Diagnosis not present

## 2022-03-10 DIAGNOSIS — E1142 Type 2 diabetes mellitus with diabetic polyneuropathy: Secondary | ICD-10-CM | POA: Diagnosis not present

## 2022-03-10 DIAGNOSIS — I10 Essential (primary) hypertension: Secondary | ICD-10-CM | POA: Diagnosis not present

## 2022-03-10 DIAGNOSIS — J302 Other seasonal allergic rhinitis: Secondary | ICD-10-CM | POA: Diagnosis not present

## 2022-03-10 DIAGNOSIS — E559 Vitamin D deficiency, unspecified: Secondary | ICD-10-CM | POA: Diagnosis not present

## 2022-03-10 DIAGNOSIS — Z733 Stress, not elsewhere classified: Secondary | ICD-10-CM | POA: Diagnosis not present

## 2022-03-10 DIAGNOSIS — Z86010 Personal history of colonic polyps: Secondary | ICD-10-CM | POA: Diagnosis not present

## 2022-03-10 DIAGNOSIS — M79672 Pain in left foot: Secondary | ICD-10-CM | POA: Diagnosis not present

## 2022-03-10 DIAGNOSIS — R944 Abnormal results of kidney function studies: Secondary | ICD-10-CM | POA: Diagnosis not present

## 2022-06-09 DIAGNOSIS — E782 Mixed hyperlipidemia: Secondary | ICD-10-CM | POA: Diagnosis not present

## 2022-06-09 DIAGNOSIS — E1142 Type 2 diabetes mellitus with diabetic polyneuropathy: Secondary | ICD-10-CM | POA: Diagnosis not present

## 2022-06-16 DIAGNOSIS — Z733 Stress, not elsewhere classified: Secondary | ICD-10-CM | POA: Diagnosis not present

## 2022-06-16 DIAGNOSIS — E559 Vitamin D deficiency, unspecified: Secondary | ICD-10-CM | POA: Diagnosis not present

## 2022-06-16 DIAGNOSIS — M79671 Pain in right foot: Secondary | ICD-10-CM | POA: Diagnosis not present

## 2022-06-16 DIAGNOSIS — I10 Essential (primary) hypertension: Secondary | ICD-10-CM | POA: Diagnosis not present

## 2022-06-16 DIAGNOSIS — E1142 Type 2 diabetes mellitus with diabetic polyneuropathy: Secondary | ICD-10-CM | POA: Diagnosis not present

## 2022-06-16 DIAGNOSIS — Q7962 Hypermobile Ehlers-Danlos syndrome: Secondary | ICD-10-CM | POA: Diagnosis not present

## 2022-06-16 DIAGNOSIS — E875 Hyperkalemia: Secondary | ICD-10-CM | POA: Diagnosis not present

## 2022-06-16 DIAGNOSIS — E782 Mixed hyperlipidemia: Secondary | ICD-10-CM | POA: Diagnosis not present

## 2022-06-16 DIAGNOSIS — R944 Abnormal results of kidney function studies: Secondary | ICD-10-CM | POA: Diagnosis not present

## 2022-06-16 DIAGNOSIS — Z86010 Personal history of colonic polyps: Secondary | ICD-10-CM | POA: Diagnosis not present

## 2022-06-16 DIAGNOSIS — M79672 Pain in left foot: Secondary | ICD-10-CM | POA: Diagnosis not present

## 2022-06-16 DIAGNOSIS — J302 Other seasonal allergic rhinitis: Secondary | ICD-10-CM | POA: Diagnosis not present

## 2022-06-30 ENCOUNTER — Ambulatory Visit: Payer: PPO | Admitting: Sports Medicine

## 2022-06-30 ENCOUNTER — Ambulatory Visit: Payer: Self-pay

## 2022-06-30 VITALS — BP 110/72 | Ht 67.5 in | Wt 150.0 lb

## 2022-06-30 DIAGNOSIS — M7711 Lateral epicondylitis, right elbow: Secondary | ICD-10-CM | POA: Diagnosis not present

## 2022-06-30 DIAGNOSIS — M25521 Pain in right elbow: Secondary | ICD-10-CM | POA: Diagnosis not present

## 2022-06-30 MED ORDER — NITROGLYCERIN 0.2 MG/HR TD PT24: MEDICATED_PATCH | TRANSDERMAL | 1 refills | Status: DC

## 2022-06-30 NOTE — Progress Notes (Unsigned)
Established Patient Office Visit  Subjective   Patient ID: Christina Aguirre, female    DOB: May 29, 1956  Age: 66 y.o. MRN: [id number]  Christina Aguirre is a 66 yo female with a history of Ehlers-Danlos, presents with chief complaint of R elbow pain that began July 2nd immediately after lifting a heavy duffel bag.  Pain was originally located just the in her right elbow but since then has been causing some radiating pain, numbness and tingling down to her hand as well as up to her shoulder.  She has had multiple muscle strains and orthopedic injuries in the past that have improved with time so she opted to wait this long out however as her pain has been increasing she presents today.  Unfortunately secondary to kidney injury she discontinued her meloxicam.  She has been taking Tylenol with minimal relief of her pain.  She denies any weakness of grip, dropping anything however does feel subjectively weak her on that side.  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  She is right-handed.  She denies any bruising, swelling or trauma to the area.     Objective:     BP 110/72   Ht 5' 7.5" (1.715 m)   Wt 150 lb (68 kg)   BMI 23.15 kg/m    Physical Exam Vitals reviewed.  Constitutional:      General: She is not in acute distress.    Appearance: Normal appearance. She is normal weight. She is not ill-appearing, toxic-appearing or diaphoretic.  HENT:     Head: Normocephalic and atraumatic.  Pulmonary:     Effort: Pulmonary effort is normal.  Neurological:     Mental Status: She is alert.   Right elbow: No obvious deformity or asymmetry.  No ecchymosis or swelling.  No tenderness to palpation of the medial or lateral epicondyles.  Full range of motion, supination and flexion extension from -10 to 160 degrees.  Strength 5/5 flexion, extension.  Grip strength 5/5 at the wrist she has pain with  resisted wrist extension.  Negative Tinel's at the elbow.  Sensation to light touch intact.  Distal pulses, radial 2+ bilaterally.  Right elbow was examined under ultrasound: The extensor tendon was located superficial to the radial head, a small hypodense abnormality was seen within the tendon with color Doppler placed over the area, vascular structures were identified within the tendon.  There is a small hyperechoic structure area located off of the radial head.  There were no other bony abnormality seen at the radial head. With the hypodense area seen within the tendon accompanied by vascular structures it is likely she has suffered mild tearing of the extensor tendon with surrounding blood.  Hypoechoic structure located of the radial head indicative of likely prior trauma to the area with calcification.   Assessment & Plan:   Problem List Items Addressed This Visit       Musculoskeletal and Integument   Lateral epicondylitis of right elbow    Patient's clinical and radiographical exam are consistent with lateral epicondylitis.  Refill prescription for nitroglycerin patches were sent to her pharmacy today.  Patient to use one quarter of the patch over the right lateral elbow to promote blood flow and healing.  Reviewed with her possible side effects, and discussed if she experiences any new she should discontinue treatment and contact our office.  She verbalized understanding.  She  was also given a handout for strengthening exercises, extension supination and grip strength.  Patient to follow-up in 4 weeks.      Other Visit Diagnoses     Elbow pain, right    -  Primary   Relevant Orders   [place] LIMITED JOINT SPACE STRUCTURES UP RIGHT       Return in about 4 weeks (around 07/28/2022), or sooner if symptoms worsen or fail to improve.    Claudie Leach, DO

## 2022-06-30 NOTE — Assessment & Plan Note (Signed)
Patient's clinical and radiographical exam are consistent with lateral epicondylitis.  Refill prescription for nitroglycerin patches were sent to her pharmacy today.  Patient to use one quarter of the patch over the right lateral elbow to promote blood flow and healing.  Reviewed with her possible side effects, and discussed if she experiences any new she should discontinue treatment and contact our office.  She verbalized understanding.  She was also given a handout for strengthening exercises, extension supination and grip strength.  Patient to follow-up in 4 weeks.

## 2022-06-30 NOTE — Patient Instructions (Signed)

## 2022-08-11 ENCOUNTER — Ambulatory Visit: Payer: PPO | Admitting: Sports Medicine

## 2022-08-11 ENCOUNTER — Ambulatory Visit: Payer: Self-pay

## 2022-08-11 VITALS — BP 128/70 | Ht 68.0 in

## 2022-08-11 DIAGNOSIS — M7711 Lateral epicondylitis, right elbow: Secondary | ICD-10-CM | POA: Diagnosis not present

## 2022-08-11 NOTE — Progress Notes (Signed)
   Established Patient Office Visit  Subjective   Patient ID: Christina Aguirre, female    DOB: September 23, 1956  Age: 66 y.o. MRN: [id number]  F/u elbow pain.  Christina Aguirre presents today for follow up on R elbow pain since July 2nd after picking up heavy suitcase.  She reports some improvement in her pain, currently using a quarter nitroglycerin past, however it is still somewhat bothersome.  She has been unable to be quite regular with her therapy exercises in light of some family stressors, her mother passed away on March 19, 2023.  She does have some concern that she may have overdone it while caring for others these past few weeks but she denies any new acute injury.  She is tolerating nitroglycerin patches without any side effects.  ROS as listed above in HPI    Objective:     BP 128/70   Ht 5\' 8"  (1.727 m)   BMI 22.81 kg/m   Physical Exam Vitals reviewed.  Constitutional:      General: She is not in acute distress.    Appearance: She is not ill-appearing, toxic-appearing or diaphoretic.  HENT:     Head: Normocephalic.  Pulmonary:     Effort: Pulmonary effort is normal.  Neurological:     Mental Status: She is alert.   Right elbow: No obvious deformity or asymmetry.  No ecchymosis or swelling.  She does have some notable hyperextension at the elbow.  Some tenderness to palpation over the lateral epicondyles.  Full range of motion flexion extension at the wrist and elbow noted to have some hyperextension.  Strength 5/5 pain with resisted wrist extension.  She is able to hold textbook out with only slight discomfort.  Sensation to light touch intact.  Distal pulses, radial 2+ bilateral.  Limited right elbow ultrasound, lateral epicondyle Extensor tendon- visualized, intact fibers on short and long axis views.  No hypodense areas are visualized.  Medial vessels visualized on color Doppler today. Lateral epicondyle-visualized, there is a small hyperechoic structure located of the supra  superficial aspect of the radial head, no other bony abnormality seen. Impression: Findings are consistent with lateral epicondylitis and bone spur/calcification off the tip of the epicondyle.  I did not visualize the hypodense area of the tendon is visualized at previous scan 8/8.  There are neo vessels seen today, indicative of healing process.  Ultrasound performed and interpretation by Peterson Ao B. Oneida Alar, MD and Doyne Keel. Christina Berent, DO   Assessment & Plan:   Problem List Items Addressed This Visit       Musculoskeletal and Integument   Lateral epicondylitis of right elbow - Primary    She is slowly having improvement with home exercise plan and nitroglycerin patch, 1/4 We will increase nitroglycerin patch to 1/2.  Patient was counseled on side effects and to discontinue if she develops these.  She verbalized understanding We will also send an order for formal physical therapy.  Follow-up in 6 weeks      Relevant Orders   [place] LIMITED JOINT SPACE STRUCTURES UP RIGHT   Ambulatory referral to Physical Therapy    Return in about 6 weeks (around 09/22/2022), or sooner if symptoms worsen or fail to improve.    Elmore Guise, DO  I observed and examined the patient with the resident and agree with assessment and plan.  Note reviewed and modified by me. Ila Mcgill, MD

## 2022-08-11 NOTE — Assessment & Plan Note (Signed)
She is slowly having improvement with home exercise plan and nitroglycerin patch, 1/4 We will increase nitroglycerin patch to 1/2.  Patient was counseled on side effects and to discontinue if she develops these.  She verbalized understanding We will also send an order for formal physical therapy.  Follow-up in 6 weeks

## 2022-08-13 DIAGNOSIS — M7711 Lateral epicondylitis, right elbow: Secondary | ICD-10-CM | POA: Diagnosis not present

## 2022-08-18 DIAGNOSIS — M7711 Lateral epicondylitis, right elbow: Secondary | ICD-10-CM | POA: Diagnosis not present

## 2022-08-25 DIAGNOSIS — M7711 Lateral epicondylitis, right elbow: Secondary | ICD-10-CM | POA: Diagnosis not present

## 2022-08-27 DIAGNOSIS — M7711 Lateral epicondylitis, right elbow: Secondary | ICD-10-CM | POA: Diagnosis not present

## 2022-09-01 DIAGNOSIS — M7711 Lateral epicondylitis, right elbow: Secondary | ICD-10-CM | POA: Diagnosis not present

## 2022-09-03 DIAGNOSIS — M7711 Lateral epicondylitis, right elbow: Secondary | ICD-10-CM | POA: Diagnosis not present

## 2022-09-07 DIAGNOSIS — M7711 Lateral epicondylitis, right elbow: Secondary | ICD-10-CM | POA: Diagnosis not present

## 2022-09-09 DIAGNOSIS — M7711 Lateral epicondylitis, right elbow: Secondary | ICD-10-CM | POA: Diagnosis not present

## 2022-09-16 DIAGNOSIS — M7711 Lateral epicondylitis, right elbow: Secondary | ICD-10-CM | POA: Diagnosis not present

## 2022-09-22 ENCOUNTER — Ambulatory Visit: Payer: PPO | Admitting: Sports Medicine

## 2022-09-22 ENCOUNTER — Other Ambulatory Visit (HOSPITAL_COMMUNITY): Payer: Self-pay | Admitting: Internal Medicine

## 2022-09-22 DIAGNOSIS — Z1231 Encounter for screening mammogram for malignant neoplasm of breast: Secondary | ICD-10-CM

## 2022-09-22 DIAGNOSIS — M7711 Lateral epicondylitis, right elbow: Secondary | ICD-10-CM

## 2022-09-22 NOTE — Assessment & Plan Note (Signed)
She is making good progress She will do her last 2 physical therapy sessions and then continue on the home exercises We will use her nitroglycerin an additional 3 months She will return at that time for final ultrasound and see if she is okay

## 2022-09-22 NOTE — Progress Notes (Signed)
F/u tennis elbow RT at 3 months  Patient feels that her elbow pain is much less She has had several sessions of physical therapy and that has helped The nitroglycerin has seem to give her relief and she has no side effects She is able to do the tennis elbow exercises now with 2 pounds  Physical examination Pleasant older female in no acute distress BP 124/78   Ht 5\' 8"  (1.727 m)   Wt 150 lb (68 kg)   BMI 22.81 kg/m   Elbow shows full range of motion There is some minimal tenderness on palpation of the lateral epicondyle Resisted finger and wrist extension were not painful until I put her in a flexed wrist position Blood test is essentially negative No numbness on exam

## 2022-09-25 DIAGNOSIS — E1142 Type 2 diabetes mellitus with diabetic polyneuropathy: Secondary | ICD-10-CM | POA: Diagnosis not present

## 2022-09-25 DIAGNOSIS — E782 Mixed hyperlipidemia: Secondary | ICD-10-CM | POA: Diagnosis not present

## 2022-09-30 DIAGNOSIS — J302 Other seasonal allergic rhinitis: Secondary | ICD-10-CM | POA: Diagnosis not present

## 2022-09-30 DIAGNOSIS — Z86010 Personal history of colonic polyps: Secondary | ICD-10-CM | POA: Diagnosis not present

## 2022-09-30 DIAGNOSIS — M79672 Pain in left foot: Secondary | ICD-10-CM | POA: Diagnosis not present

## 2022-09-30 DIAGNOSIS — E559 Vitamin D deficiency, unspecified: Secondary | ICD-10-CM | POA: Diagnosis not present

## 2022-09-30 DIAGNOSIS — E875 Hyperkalemia: Secondary | ICD-10-CM | POA: Diagnosis not present

## 2022-09-30 DIAGNOSIS — M79671 Pain in right foot: Secondary | ICD-10-CM | POA: Diagnosis not present

## 2022-09-30 DIAGNOSIS — F320 Major depressive disorder, single episode, mild: Secondary | ICD-10-CM | POA: Diagnosis not present

## 2022-09-30 DIAGNOSIS — R944 Abnormal results of kidney function studies: Secondary | ICD-10-CM | POA: Diagnosis not present

## 2022-09-30 DIAGNOSIS — I10 Essential (primary) hypertension: Secondary | ICD-10-CM | POA: Diagnosis not present

## 2022-09-30 DIAGNOSIS — E1142 Type 2 diabetes mellitus with diabetic polyneuropathy: Secondary | ICD-10-CM | POA: Diagnosis not present

## 2022-09-30 DIAGNOSIS — E782 Mixed hyperlipidemia: Secondary | ICD-10-CM | POA: Diagnosis not present

## 2022-09-30 DIAGNOSIS — Q7962 Hypermobile Ehlers-Danlos syndrome: Secondary | ICD-10-CM | POA: Diagnosis not present

## 2022-10-26 ENCOUNTER — Ambulatory Visit (HOSPITAL_COMMUNITY)
Admission: RE | Admit: 2022-10-26 | Discharge: 2022-10-26 | Disposition: A | Discharge status: Home / Self Care | Payer: PPO | Source: Ambulatory Visit | Attending: Internal Medicine | Admitting: Internal Medicine

## 2022-10-26 DIAGNOSIS — Z1231 Encounter for screening mammogram for malignant neoplasm of breast: Secondary | ICD-10-CM | POA: Diagnosis not present

## 2022-10-28 DIAGNOSIS — H524 Presbyopia: Secondary | ICD-10-CM | POA: Diagnosis not present

## 2022-10-28 DIAGNOSIS — H52203 Unspecified astigmatism, bilateral: Secondary | ICD-10-CM | POA: Diagnosis not present

## 2022-10-28 DIAGNOSIS — E119 Type 2 diabetes mellitus without complications: Secondary | ICD-10-CM | POA: Diagnosis not present

## 2022-10-28 DIAGNOSIS — H25813 Combined forms of age-related cataract, bilateral: Secondary | ICD-10-CM | POA: Diagnosis not present

## 2022-11-12 DIAGNOSIS — F330 Major depressive disorder, recurrent, mild: Secondary | ICD-10-CM | POA: Diagnosis not present

## 2022-11-12 DIAGNOSIS — Z79899 Other long term (current) drug therapy: Secondary | ICD-10-CM | POA: Diagnosis not present

## 2022-11-12 DIAGNOSIS — Z634 Disappearance and death of family member: Secondary | ICD-10-CM | POA: Diagnosis not present

## 2022-11-12 DIAGNOSIS — Q7962 Hypermobile Ehlers-Danlos syndrome: Secondary | ICD-10-CM | POA: Diagnosis not present

## 2022-11-12 DIAGNOSIS — Z713 Dietary counseling and surveillance: Secondary | ICD-10-CM | POA: Diagnosis not present

## 2022-11-12 DIAGNOSIS — Z7182 Exercise counseling: Secondary | ICD-10-CM | POA: Diagnosis not present

## 2022-11-12 DIAGNOSIS — I10 Essential (primary) hypertension: Secondary | ICD-10-CM | POA: Diagnosis not present

## 2022-12-22 ENCOUNTER — Ambulatory Visit: Payer: PPO | Admitting: Sports Medicine

## 2022-12-25 DIAGNOSIS — R053 Chronic cough: Secondary | ICD-10-CM | POA: Diagnosis not present

## 2022-12-25 DIAGNOSIS — K219 Gastro-esophageal reflux disease without esophagitis: Secondary | ICD-10-CM | POA: Diagnosis not present

## 2023-01-03 DIAGNOSIS — U071 COVID-19: Secondary | ICD-10-CM | POA: Diagnosis not present

## 2023-01-13 DIAGNOSIS — U099 Post covid-19 condition, unspecified: Secondary | ICD-10-CM | POA: Diagnosis not present

## 2023-01-13 DIAGNOSIS — R059 Cough, unspecified: Secondary | ICD-10-CM | POA: Diagnosis not present

## 2023-01-13 DIAGNOSIS — J019 Acute sinusitis, unspecified: Secondary | ICD-10-CM | POA: Diagnosis not present

## 2023-01-22 DIAGNOSIS — J019 Acute sinusitis, unspecified: Secondary | ICD-10-CM | POA: Diagnosis not present

## 2023-01-22 DIAGNOSIS — R059 Cough, unspecified: Secondary | ICD-10-CM | POA: Diagnosis not present

## 2023-01-23 DIAGNOSIS — J189 Pneumonia, unspecified organism: Secondary | ICD-10-CM | POA: Diagnosis not present

## 2023-01-23 DIAGNOSIS — Z6824 Body mass index (BMI) 24.0-24.9, adult: Secondary | ICD-10-CM | POA: Diagnosis not present

## 2023-01-23 DIAGNOSIS — J40 Bronchitis, not specified as acute or chronic: Secondary | ICD-10-CM | POA: Diagnosis not present

## 2023-02-17 DIAGNOSIS — E782 Mixed hyperlipidemia: Secondary | ICD-10-CM | POA: Diagnosis not present

## 2023-02-17 DIAGNOSIS — E559 Vitamin D deficiency, unspecified: Secondary | ICD-10-CM | POA: Diagnosis not present

## 2023-02-17 DIAGNOSIS — E1142 Type 2 diabetes mellitus with diabetic polyneuropathy: Secondary | ICD-10-CM | POA: Diagnosis not present

## 2023-02-22 DIAGNOSIS — R059 Cough, unspecified: Secondary | ICD-10-CM | POA: Diagnosis not present

## 2023-02-22 DIAGNOSIS — J019 Acute sinusitis, unspecified: Secondary | ICD-10-CM | POA: Diagnosis not present

## 2023-02-24 DIAGNOSIS — M79671 Pain in right foot: Secondary | ICD-10-CM | POA: Diagnosis not present

## 2023-02-24 DIAGNOSIS — E559 Vitamin D deficiency, unspecified: Secondary | ICD-10-CM | POA: Diagnosis not present

## 2023-02-24 DIAGNOSIS — F330 Major depressive disorder, recurrent, mild: Secondary | ICD-10-CM | POA: Diagnosis not present

## 2023-02-24 DIAGNOSIS — J302 Other seasonal allergic rhinitis: Secondary | ICD-10-CM | POA: Diagnosis not present

## 2023-02-24 DIAGNOSIS — R944 Abnormal results of kidney function studies: Secondary | ICD-10-CM | POA: Diagnosis not present

## 2023-02-24 DIAGNOSIS — E875 Hyperkalemia: Secondary | ICD-10-CM | POA: Diagnosis not present

## 2023-02-24 DIAGNOSIS — E782 Mixed hyperlipidemia: Secondary | ICD-10-CM | POA: Diagnosis not present

## 2023-02-24 DIAGNOSIS — Q7962 Hypermobile Ehlers-Danlos syndrome: Secondary | ICD-10-CM | POA: Diagnosis not present

## 2023-02-24 DIAGNOSIS — E1169 Type 2 diabetes mellitus with other specified complication: Secondary | ICD-10-CM | POA: Diagnosis not present

## 2023-02-24 DIAGNOSIS — I10 Essential (primary) hypertension: Secondary | ICD-10-CM | POA: Diagnosis not present

## 2023-02-24 DIAGNOSIS — M79672 Pain in left foot: Secondary | ICD-10-CM | POA: Diagnosis not present

## 2023-02-24 DIAGNOSIS — M7711 Lateral epicondylitis, right elbow: Secondary | ICD-10-CM | POA: Diagnosis not present

## 2023-03-18 IMAGING — MG MM DIGITAL SCREENING BILAT W/ TOMO AND CAD
8 series · 8 of 24 positions shown · non-contrast
Comparison: Previous exam(s).

CLINICAL DATA: Screening.

EXAM:
DIGITAL SCREENING BILATERAL MAMMOGRAM WITH TOMOSYNTHESIS AND CAD
TECHNIQUE: Bilateral screening digital craniocaudal and mediolateral oblique
mammograms were obtained. Bilateral screening digital breast
tomosynthesis was performed. The images were evaluated with
computer-aided detection.

[R MLO synth-2D]
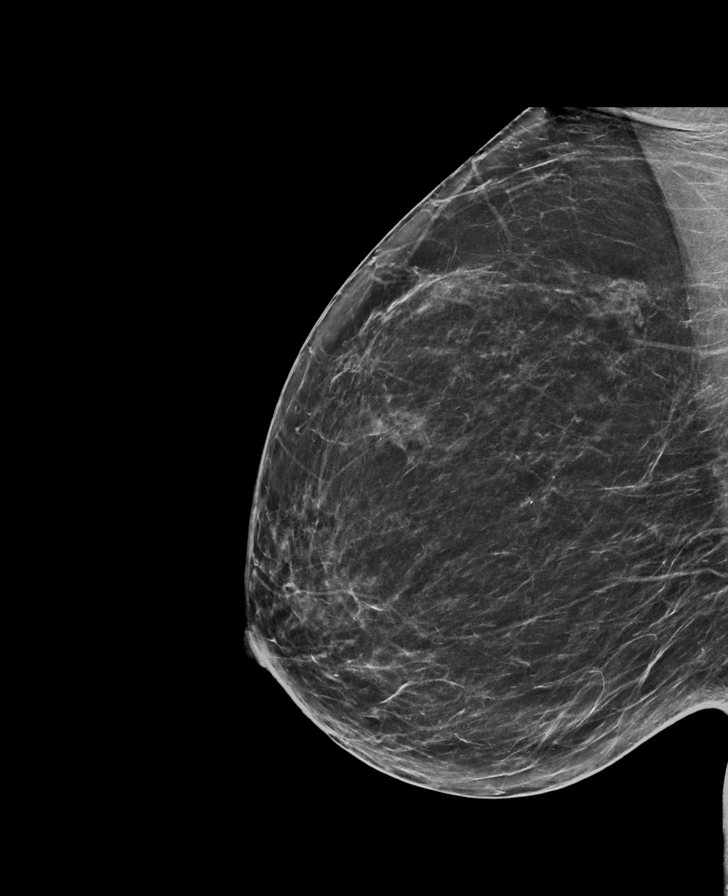

[L CC synth-2D]
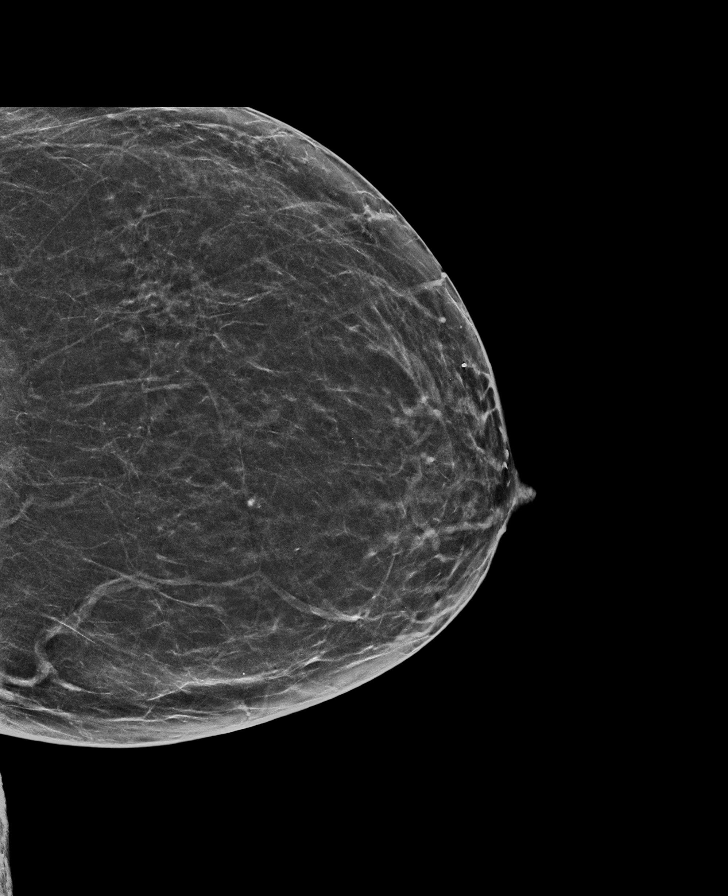

[L MLO synth-2D]
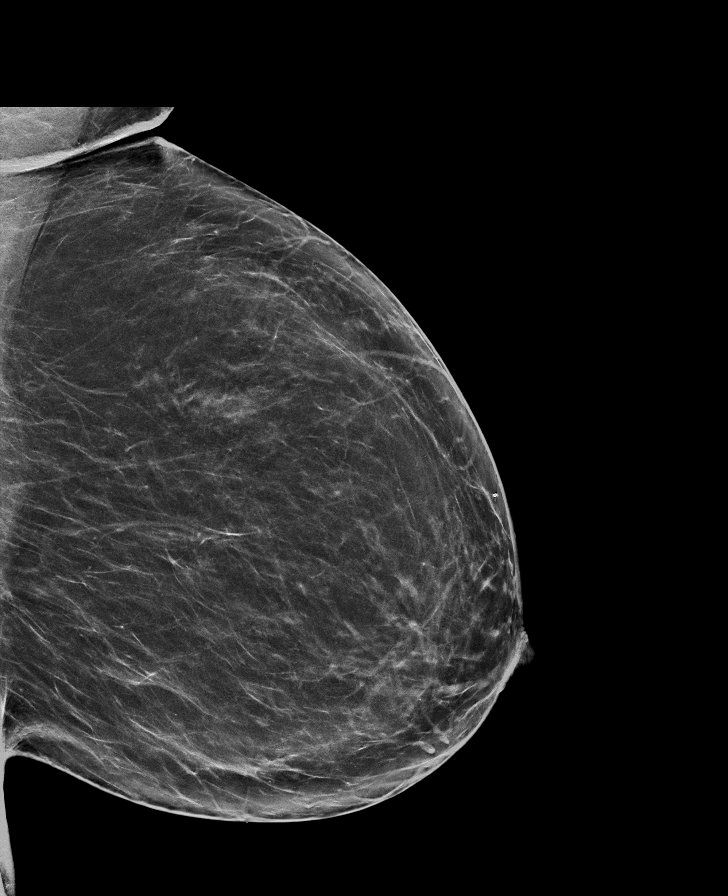

[R CC synth-2D]
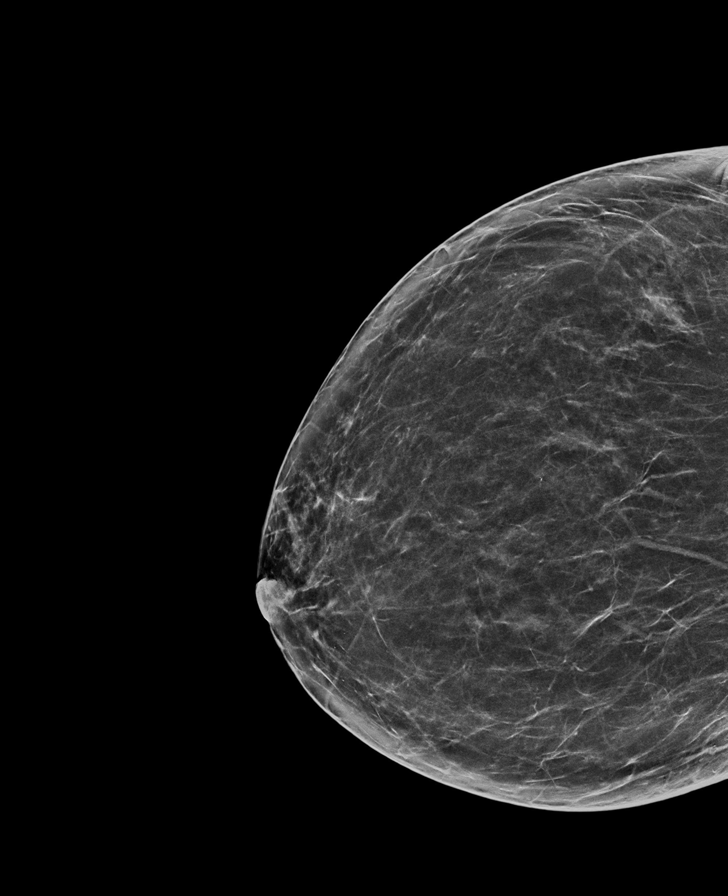

[R CC tomo · tomo slice 34/67.0]
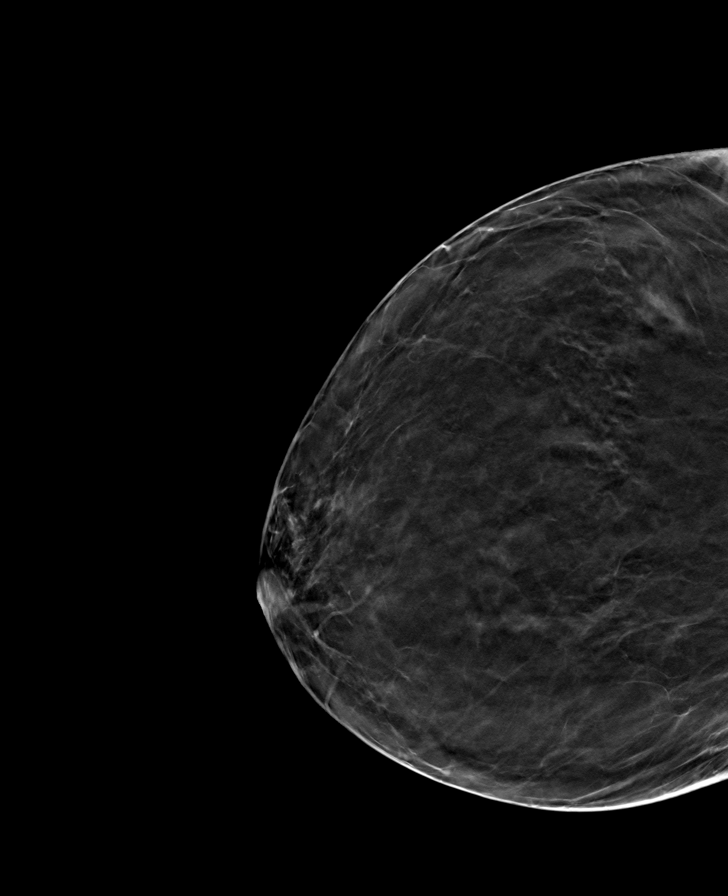

[R MLO tomo · tomo slice 35/69.0]
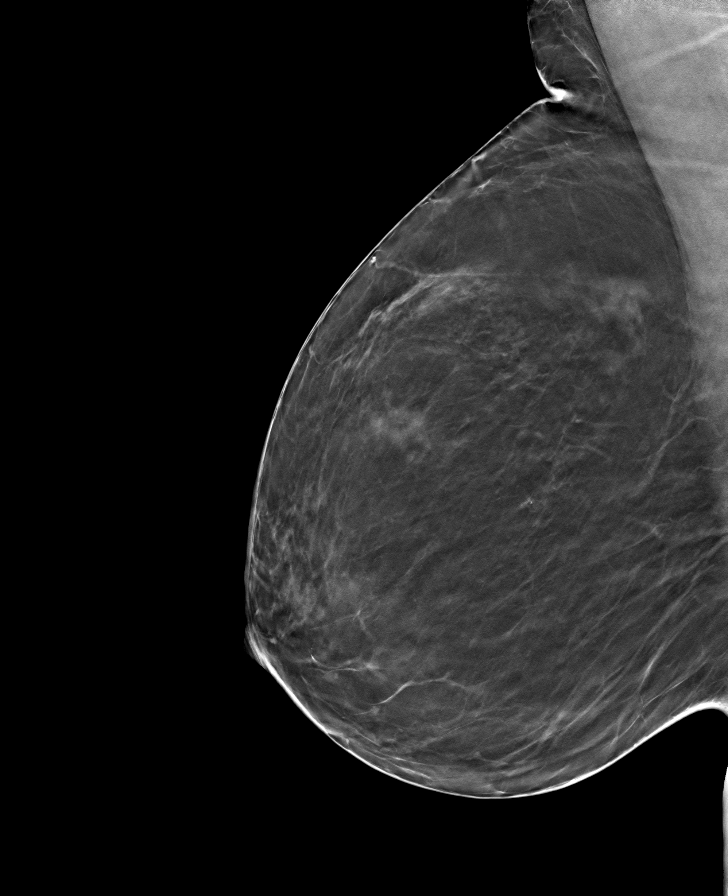

[L CC tomo · tomo slice 33/66.0]
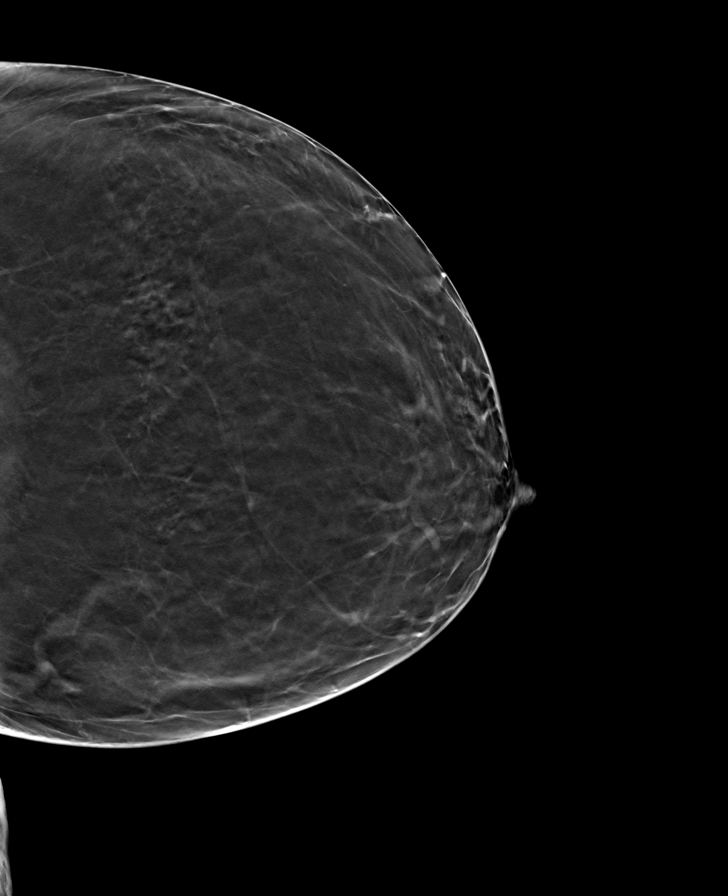

[L MLO tomo · tomo slice 37/72.0]
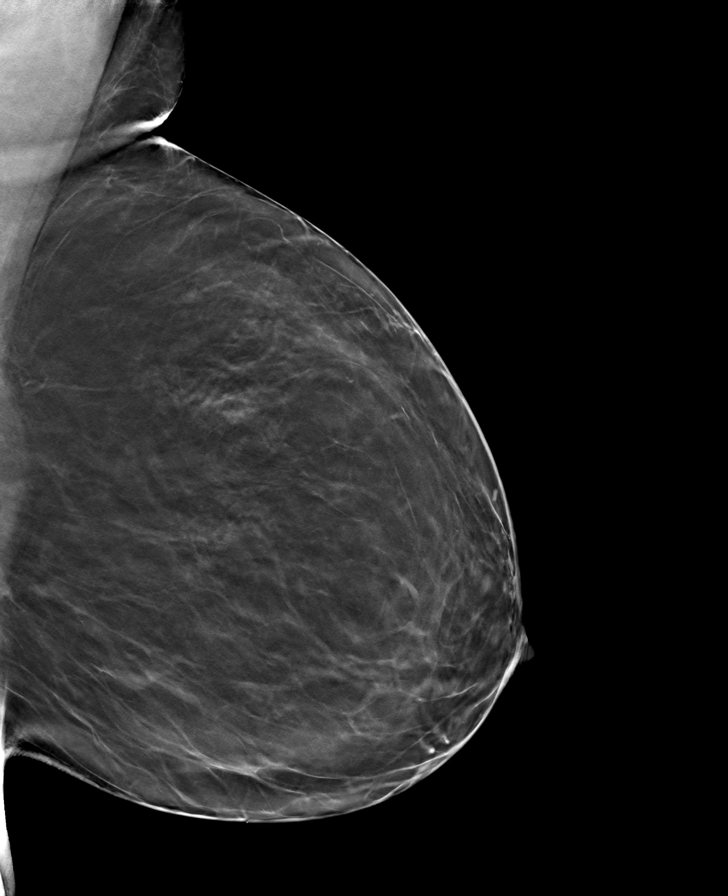

[8 of 24 positions shown; findings below may reference images not displayed]

ACR Breast Density Category b: There are scattered areas of
fibroglandular density.
FINDINGS: There are no findings suspicious for malignancy.
IMPRESSION: No mammographic evidence of malignancy. A result letter of this
screening mammogram will be mailed directly to the patient.

RECOMMENDATION:
Screening mammogram in one year. (Code:51-O-LD2)

BI-RADS CATEGORY  1: Negative.

## 2023-03-24 DIAGNOSIS — J019 Acute sinusitis, unspecified: Secondary | ICD-10-CM | POA: Diagnosis not present

## 2023-03-24 DIAGNOSIS — R059 Cough, unspecified: Secondary | ICD-10-CM | POA: Diagnosis not present

## 2023-04-24 DIAGNOSIS — J019 Acute sinusitis, unspecified: Secondary | ICD-10-CM | POA: Diagnosis not present

## 2023-04-24 DIAGNOSIS — R059 Cough, unspecified: Secondary | ICD-10-CM | POA: Diagnosis not present

## 2023-05-10 NOTE — Progress Notes (Unsigned)
Referring Provider: Benita Stabile, MD Primary Care Physician:  Benita Stabile, MD Primary GI Physician: Dr. Jena Gauss  No chief complaint on file.   HPI:   Christina Aguirre is a 67 y.o. female presenting today with a history of diabetes, mitral valve prolapse, Ehlers-Danlos syndrome, adenomatous colon polyp in 2014, presenting today at the request of Dr. Margo Aye for dysphagia.  Today:      Last colonoscopy 10/23/2020 with normal exam.  Recommended 5-year surveillance due to history of colon polyps and family history of colon cancer.  Past Medical History:  Diagnosis Date   Anemia    Arthritis    Degenerative disc disease    Diabetes mellitus    Ehler's-Danlos syndrome    History of bronchitis    Mitral valve prolapse    echo 20 yr ago   PONV (postoperative nausea and vomiting)    Self-catheterizes urinary bladder    2 times a day as needed    Past Surgical History:  Procedure Laterality Date   ABDOMINAL HYSTERECTOMY     COLONOSCOPY N/A 10/23/2020   Procedure: COLONOSCOPY;  Surgeon: Corbin Ade, MD;  Location: [facility];  Service: Endoscopy;  Laterality: N/A;  1:00   CYSTOCELE REPAIR  08/24/2012   Procedure: ANTERIOR REPAIR (CYSTOCELE);  Surgeon: Lazaro Arms, MD;  Location: [facility];  Service: Gynecology;  Laterality: N/A;  ANTERIOR REPAIR WITH GRAFT   CYSTOSCOPY  08/24/2012   Procedure: CYSTOSCOPY;  Surgeon: Lazaro Arms, MD;  Location: [facility];  Service: Gynecology;  Laterality: N/A;   DIAGNOSTIC LAPAROSCOPY     DILATION AND CURETTAGE OF UTERUS     miscarriages   OPEN REDUCTION INTERNAL FIXATION (ORIF) METACARPAL Left 02/01/2014   Procedure: LEFT RING FINGER FRACTURE OPEN TREATMENT METACARPAL SINGLE INCLUDES INTERNAL FIXATION EACH BONE;  Surgeon: Sheral Apley, MD;  Location: [facility];  Service: Orthopedics;  Laterality: Left;   PUBOVAGINAL SLING  08/24/2012   Procedure: Leonides Grills;  Surgeon: Lazaro Arms, MD;  Location: [facility];  Service:  Gynecology;  Laterality: N/A;   TONSILLECTOMY     VAGINAL HYSTERECTOMY  08/24/2012   Procedure: HYSTERECTOMY VAGINAL;  Surgeon: Lazaro Arms, MD;  Location: [facility];  Service: Gynecology;  Laterality: N/A;   VAGINAL PROLAPSE REPAIR  08/24/2012   Procedure: VAGINAL VAULT SUSPENSION;  Surgeon: Lazaro Arms, MD;  Location: [facility];  Service: Gynecology;  Laterality: N/A;    Current Outpatient Medications  Medication Sig Dispense Refill   acetaminophen (TYLENOL) 500 MG tablet Take 1,000 mg by mouth daily as needed for mild pain or moderate pain.      albuterol (VENTOLIN HFA) 108 (90 Base) MCG/ACT inhaler Inhale 1-2 puffs into the lungs every 6 (six) hours as needed for wheezing or shortness of breath. 18 g 0   Ascorbic Acid (VITAMIN C) 500 MG CAPS Take 500 mg by mouth in the morning and at bedtime.      atorvastatin (LIPITOR) 80 MG tablet Take 80 mg by mouth at bedtime.      cetirizine (ZYRTEC) 10 MG tablet Take 10 mg by mouth daily.     cholecalciferol (VITAMIN D) 1000 UNITS tablet Take 1,000 Units by mouth daily.     COLLAGEN PO Take 1 g by mouth daily.      fluticasone (FLONASE) 50 MCG/ACT nasal spray Place 2 sprays into the nose daily.     gabapentin (NEURONTIN) 300 MG capsule Take 2 capsules (600 mg total)  by mouth at bedtime. (Patient taking differently: Take 600 mg by mouth 2 (two) times daily as needed (Nerve pain). ) 30 capsule 2   glimepiride (AMARYL) 1 MG tablet Take 1 mg by mouth daily before breakfast.     ibuprofen (ADVIL) 200 MG tablet Take 400 mg by mouth daily as needed.      ketotifen (ALLERGY EYE DROPS) 0.025 % ophthalmic solution Place 1 drop into both eyes daily as needed (allergies).     lisinopril (ZESTRIL) 10 MG tablet Take 10 mg by mouth daily.     meloxicam (MOBIC) 15 MG tablet Take 15 mg by mouth daily.     metFORMIN (GLUCOPHAGE-XR) 500 MG 24 hr tablet Take 500 mg by mouth daily with supper.      montelukast (SINGULAIR) 10 MG tablet Take 10 mg by mouth daily.       nitroGLYCERIN (NITRODUR - DOSED IN MG/24 HR) 0.2 mg/hr patch Place 1/4 patch to affected area daily 30 patch 1   vitamin B-12 (CYANOCOBALAMIN) 1000 MCG tablet Take 1,000 mcg by mouth daily.     No current facility-administered medications for this visit.    Allergies as of 05/12/2023 - Review Complete 09/22/2022  Allergen Reaction Noted   Bee venom Swelling 06/17/2016   Latex Hives 08/09/2012   Penicillins Other (See Comments) 08/09/2012   Vancomycin Hives, Itching, and Nausea And Vomiting 08/09/2012   Doxycycline Swelling and Rash 08/09/2012    Family History  Problem Relation Age of Onset   Cancer Mother        colon    Hypertension Other    Diabetes Other    Stroke Other    Coronary artery disease Other     Social History   Socioeconomic History   Marital status: Married    Spouse name: Not on file   Number of children: Not on file   Years of education: Not on file   Highest education level: Not on file  Occupational History   Not on file  Tobacco Use   Smoking status: Never   Smokeless tobacco: Never  Substance and Sexual Activity   Alcohol use: Yes    Alcohol/week: 1.0 standard drink of alcohol    Types: 1 Glasses of wine per week    Comment: wine   Drug use: No   Sexual activity: Yes    Birth control/protection: Post-menopausal  Other Topics Concern   Not on file  Social History Narrative   Not on file   Social Determinants of Health   Financial Resource Strain: Not on file  Food Insecurity: Not on file  Transportation Needs: Not on file  Physical Activity: Not on file  Stress: Not on file  Social Connections: Not on file    Review of Systems: Gen: Denies fever, chills, cold or flu like symptoms, pre-syncope, or syncope.  CV: Denies chest pain, palpitations. Resp: Denies dyspnea at rest, cough. GI: See HPI Derm: Denies rash. Psych: Denies depression, anxiety. Heme: See HPI  Physical Exam: There were no vitals taken for this visit. General:    Alert and oriented. No distress noted. Pleasant and cooperative.  Head:  Normocephalic and atraumatic. Eyes:  Conjuctiva clear without scleral icterus. Heart:  S1, S2 present without murmurs appreciated. Lungs:  Clear to auscultation bilaterally. No wheezes, rales, or rhonchi. No distress.  Abdomen:  +BS, soft, non-tender and non-distended. No rebound or guarding. No HSM or masses noted. Msk:  Symmetrical without gross deformities. Normal posture. Extremities:  Without edema. Neurologic:  Alert and  oriented x4 Psych:  Normal mood and affect.    Assessment:     Plan:  ***   Ermalinda Memos, PA-C [facility] 05/12/2023

## 2023-05-12 ENCOUNTER — Encounter: Payer: Self-pay | Admitting: Gastroenterology

## 2023-05-12 ENCOUNTER — Encounter: Payer: Self-pay | Admitting: *Deleted

## 2023-05-12 ENCOUNTER — Ambulatory Visit (INDEPENDENT_AMBULATORY_CARE_PROVIDER_SITE_OTHER): Payer: PPO | Admitting: Gastroenterology

## 2023-05-12 VITALS — BP 134/75 | HR 78 | Temp 97.9°F | Ht 67.0 in | Wt 154.0 lb

## 2023-05-12 DIAGNOSIS — R1310 Dysphagia, unspecified: Secondary | ICD-10-CM | POA: Diagnosis not present

## 2023-05-12 NOTE — Patient Instructions (Addendum)
We will arrange to have an upper endoscopy with possible stretching of your esophagus with Dr. Jena Gauss in the near future.  Swallowing precautions:  Eat slowly, take small bites, chew thoroughly, drink plenty of liquids throughout meals.  Avoid trough textures All meats should be chopped finely.  If something gets hung in your esophagus and will not come up or go down, proceed to the emergency room.    We will follow-up with you in the office after your procedure.  Do not hesitate to call sooner if you have questions or concerns.  It was very nice to meet you today!  Ermalinda Memos, PA-C [facility]

## 2023-05-18 ENCOUNTER — Encounter: Payer: Self-pay | Admitting: Gastroenterology

## 2023-05-24 DIAGNOSIS — R059 Cough, unspecified: Secondary | ICD-10-CM | POA: Diagnosis not present

## 2023-05-24 DIAGNOSIS — J019 Acute sinusitis, unspecified: Secondary | ICD-10-CM | POA: Diagnosis not present

## 2023-06-17 ENCOUNTER — Encounter (HOSPITAL_COMMUNITY)
Admission: RE | Disposition: A | Discharge status: Home / Self Care | Payer: Self-pay | Source: Home / Self Care | Attending: Internal Medicine

## 2023-06-17 ENCOUNTER — Ambulatory Visit (HOSPITAL_BASED_OUTPATIENT_CLINIC_OR_DEPARTMENT_OTHER): Payer: PPO | Admitting: Anesthesiology

## 2023-06-17 ENCOUNTER — Other Ambulatory Visit: Payer: Self-pay

## 2023-06-17 ENCOUNTER — Encounter (HOSPITAL_COMMUNITY): Payer: Self-pay | Admitting: Internal Medicine

## 2023-06-17 ENCOUNTER — Ambulatory Visit (HOSPITAL_COMMUNITY): Payer: PPO | Admitting: Anesthesiology

## 2023-06-17 ENCOUNTER — Ambulatory Visit (HOSPITAL_COMMUNITY)
Admission: RE | Admit: 2023-06-17 | Discharge: 2023-06-17 | Disposition: A | Discharge status: Home / Self Care | Payer: PPO | Attending: Internal Medicine | Admitting: Internal Medicine

## 2023-06-17 DIAGNOSIS — R1314 Dysphagia, pharyngoesophageal phase: Secondary | ICD-10-CM | POA: Insufficient documentation

## 2023-06-17 DIAGNOSIS — M545 Low back pain, unspecified: Secondary | ICD-10-CM

## 2023-06-17 DIAGNOSIS — R1310 Dysphagia, unspecified: Secondary | ICD-10-CM

## 2023-06-17 DIAGNOSIS — Q796 Ehlers-Danlos syndrome, unspecified: Secondary | ICD-10-CM | POA: Insufficient documentation

## 2023-06-17 DIAGNOSIS — E119 Type 2 diabetes mellitus without complications: Secondary | ICD-10-CM | POA: Diagnosis not present

## 2023-06-17 HISTORY — PX: ESOPHAGOGASTRODUODENOSCOPY (EGD) WITH PROPOFOL: SHX5813

## 2023-06-17 HISTORY — PX: BIOPSY: SHX5522

## 2023-06-17 HISTORY — PX: MALONEY DILATION: SHX5535

## 2023-06-17 LAB — GLUCOSE, CAPILLARY: Glucose-Capillary: 103 mg/dL — ABNORMAL HIGH (ref 70–99)

## 2023-06-17 SURGERY — ESOPHAGOGASTRODUODENOSCOPY (EGD) WITH PROPOFOL
Anesthesia: General

## 2023-06-17 MED ORDER — PROPOFOL 1000 MG/100ML IV EMUL
INTRAVENOUS | Status: AC
  Filled 2023-06-17: qty 200

## 2023-06-17 MED ORDER — EPHEDRINE 5 MG/ML INJ
INTRAVENOUS | Status: AC
  Filled 2023-06-17: qty 5

## 2023-06-17 MED ORDER — PROPOFOL 10 MG/ML IV BOLUS
INTRAVENOUS | Status: DC | PRN
Start: 2023-06-17 — End: 2023-06-17
  Administered 2023-06-17: 50 mg via INTRAVENOUS
  Administered 2023-06-17: 40 mg via INTRAVENOUS
  Administered 2023-06-17: 80 mg via INTRAVENOUS

## 2023-06-17 MED ORDER — STERILE WATER FOR IRRIGATION IR SOLN
Status: DC | PRN
  Administered 2023-06-17: 120 mL

## 2023-06-17 MED ORDER — ONDANSETRON HCL 4 MG/2ML IJ SOLN
INTRAMUSCULAR | Status: AC
  Filled 2023-06-17: qty 2

## 2023-06-17 MED ORDER — PHENYLEPHRINE 80 MCG/ML (10ML) SYRINGE FOR IV PUSH (FOR BLOOD PRESSURE SUPPORT)
PREFILLED_SYRINGE | INTRAVENOUS | Status: DC | PRN
  Administered 2023-06-17 (×2): 160 ug via INTRAVENOUS

## 2023-06-17 MED ORDER — LIDOCAINE HCL (CARDIAC) PF 100 MG/5ML IV SOSY
PREFILLED_SYRINGE | INTRAVENOUS | Status: DC | PRN
  Administered 2023-06-17: 100 mg via INTRAVENOUS

## 2023-06-17 MED ORDER — LACTATED RINGERS IV SOLN: INTRAVENOUS | Status: DC

## 2023-06-17 MED ORDER — LACTATED RINGERS IV SOLN: INTRAVENOUS | Status: DC | PRN

## 2023-06-17 MED ORDER — PROPOFOL 500 MG/50ML IV EMUL
INTRAVENOUS | Status: DC | PRN
  Administered 2023-06-17: 150 ug/kg/min via INTRAVENOUS

## 2023-06-17 MED ORDER — EPHEDRINE SULFATE-NACL 50-0.9 MG/10ML-% IV SOSY
PREFILLED_SYRINGE | INTRAVENOUS | Status: DC | PRN
  Administered 2023-06-17: 10 mg via INTRAVENOUS

## 2023-06-17 MED ORDER — ONDANSETRON HCL 4 MG/2ML IJ SOLN
INTRAMUSCULAR | Status: DC | PRN
  Administered 2023-06-17: 4 mg via INTRAVENOUS

## 2023-06-17 NOTE — Anesthesia Preprocedure Evaluation (Signed)
Anesthesia Evaluation  Patient identified by MRN, date of birth, ID band Patient awake    Reviewed: Allergy & Precautions, H&P , NPO status , Patient's Chart, lab work & pertinent test results, reviewed documented beta blocker date and time   History of Anesthesia Complications (+) PONV and history of anesthetic complications  Airway Mallampati: II  TM Distance: >3 FB Neck ROM: full    Dental no notable dental hx.    Pulmonary neg pulmonary ROS   Pulmonary exam normal breath sounds clear to auscultation       Cardiovascular Exercise Tolerance: Good negative cardio ROS  Rhythm:regular Rate:Normal     Neuro/Psych negative neurological ROS  negative psych ROS   GI/Hepatic negative GI ROS, Neg liver ROS,,,  Endo/Other  negative endocrine ROSdiabetes    Renal/GU negative Renal ROS  negative genitourinary   Musculoskeletal   Abdominal   Peds  Hematology negative hematology ROS (+) Blood dyscrasia, anemia   Anesthesia Other Findings   Reproductive/Obstetrics negative OB ROS                             Anesthesia Physical Anesthesia Plan  ASA: 3  Anesthesia Plan: General   Post-op Pain Management:    Induction:   PONV Risk Score and Plan: Propofol infusion  Airway Management Planned:   Additional Equipment:   Intra-op Plan:   Post-operative Plan:   Informed Consent: I have reviewed the patients History and Physical, chart, labs and discussed the procedure including the risks, benefits and alternatives for the proposed anesthesia with the patient or authorized representative who has indicated his/her understanding and acceptance.     Dental Advisory Given  Plan Discussed with: CRNA  Anesthesia Plan Comments:        Anesthesia Quick Evaluation

## 2023-06-17 NOTE — Transfer of Care (Signed)
Immediate Anesthesia Transfer of Care Note  Patient: Christina Aguirre  Procedure(s) Performed: ESOPHAGOGASTRODUODENOSCOPY (EGD) WITH PROPOFOL MALONEY DILATION BIOPSY  Patient Location: PACU  Anesthesia Type:General  Level of Consciousness: drowsy and patient cooperative  Airway & Oxygen Therapy: Patient Spontanous Breathing and Patient connected to nasal cannula oxygen  Post-op Assessment: Report given to RN and Post -op Vital signs reviewed and stable  Post vital signs: Reviewed and stable  Last Vitals:  Vitals Value Taken Time  BP 132/69 06/17/23 0800  Temp 36.5 C 06/17/23 0800  Pulse 83 06/17/23 0800  Resp 21 06/17/23 0800  SpO2 96 % 06/17/23 0800    Last Pain:  Vitals:   06/17/23 0800  TempSrc: Oral  PainSc:       Patients Stated Pain Goal: 5 (06/17/23 0646)  Complications: No notable events documented.

## 2023-06-17 NOTE — Discharge Instructions (Addendum)
EGD Discharge instructions Please read the instructions outlined below and refer to this sheet in the next few weeks. These discharge instructions provide you with general information on caring for yourself after you leave the hospital. Your doctor may also give you specific instructions. While your treatment has been planned according to the most current medical practices available, unavoidable complications occasionally occur. If you have any problems or questions after discharge, please call your doctor. ACTIVITY You may resume your regular activity but move at a slower pace for the next 24 hours.  Take frequent rest periods for the next 24 hours.  Walking will help expel (get rid of) the air and reduce the bloated feeling in your abdomen.  No driving for 24 hours (because of the anesthesia (medicine) used during the test).  You may shower.  Do not sign any important legal documents or operate any machinery for 24 hours (because of the anesthesia used during the test).  NUTRITION Drink plenty of fluids.  You may resume your normal diet.  Begin with a light meal and progress to your normal diet.  Avoid alcoholic beverages for 24 hours or as instructed by your caregiver.  MEDICATIONS You may resume your normal medications unless your caregiver tells you otherwise.  WHAT YOU CAN EXPECT TODAY You may experience abdominal discomfort such as a feeling of fullness or "gas" pains.  FOLLOW-UP Your doctor will discuss the results of your test with you.  SEEK IMMEDIATE MEDICAL ATTENTION IF ANY OF THE FOLLOWING OCCUR: Excessive nausea (feeling sick to your stomach) and/or vomiting.  Severe abdominal pain and distention (swelling).  Trouble swallowing.  Temperature over 101 F (37.8 C).  Rectal bleeding or vomiting of blood.     Your upper GI tract appeared normal.  I did dilate your esophagus conservatively.  Also, biopsies of your esophagus were taken to rule out a condition called  eosinophilic esophagitis  Further recommendations to follow pending review of pathology report  Office visit with Ermalinda Memos in 3 months. Message sent to office and will contact you about appointment.   Patient request, I called Merlyn Albert at [phone] -reviewed findings and recommendations

## 2023-06-17 NOTE — H&P (Signed)
@LOGO @   Primary Care Physician:  Benita Stabile, MD Primary Gastroenterologist:  Dr. Jena Gauss  Pre-Procedure History & Physical: HPI:  Christina Aguirre is a 67 y.o. female here for further evaluation of esophageal dysphagia to food and pills.  Present for several years.  Here for EGD with esophageal dilation is feasible/appropriate per plan.  Past Medical History:  Diagnosis Date   Anemia    Arthritis    Degenerative disc disease    Diabetes mellitus    Ehler's-Danlos syndrome    History of bronchitis    Mitral valve prolapse    echo 20 yr ago   PONV (postoperative nausea and vomiting)    Self-catheterizes urinary bladder    2 times a day as needed    Past Surgical History:  Procedure Laterality Date   ABDOMINAL HYSTERECTOMY     COLONOSCOPY N/A 10/23/2020   Procedure: COLONOSCOPY;  Surgeon: Corbin Ade, MD;  [facility];  Service: Endoscopy;  Laterality: N/A;  1:00   CYSTOCELE REPAIR  08/24/2012   Procedure: ANTERIOR REPAIR (CYSTOCELE);  Surgeon: Lazaro Arms, MD;  Location: [facility];  Service: Gynecology;  Laterality: N/A;  ANTERIOR REPAIR WITH GRAFT   CYSTOSCOPY  08/24/2012   Procedure: CYSTOSCOPY;  Surgeon: Lazaro Arms, MD;  Location: [facility];  Service: Gynecology;  Laterality: N/A;   DENTAL RESTORATION/EXTRACTION WITH X-RAY     June 27   DIAGNOSTIC LAPAROSCOPY     DILATION AND CURETTAGE OF UTERUS     miscarriages   OPEN REDUCTION INTERNAL FIXATION (ORIF) METACARPAL Left 02/01/2014   Procedure: LEFT RING FINGER FRACTURE OPEN TREATMENT METACARPAL SINGLE INCLUDES INTERNAL FIXATION EACH BONE;  Surgeon: Sheral Apley, MD;  Location: [facility];  Service: Orthopedics;  Laterality: Left;   PUBOVAGINAL SLING  08/24/2012   Procedure: Leonides Grills;  Surgeon: Lazaro Arms, MD;  Location: [facility];  Service: Gynecology;  Laterality: N/A;   TONSILLECTOMY     VAGINAL HYSTERECTOMY  08/24/2012   Procedure: HYSTERECTOMY VAGINAL;  Surgeon: Lazaro Arms, MD;  Location: [facility];  Service: Gynecology;  Laterality: N/A;   VAGINAL PROLAPSE REPAIR  08/24/2012   Procedure: VAGINAL VAULT SUSPENSION;  Surgeon: Lazaro Arms, MD;  Location: [facility];  Service: Gynecology;  Laterality: N/A;    Prior to Admission medications   Medication Sig Start Date End Date Taking? Authorizing Provider  acetaminophen (TYLENOL) 500 MG tablet Take 1,000 mg by mouth daily as needed for mild pain or moderate pain.    Yes [provider]  albuterol (VENTOLIN HFA) 108 (90 Base) MCG/ACT inhaler Inhale 1-2 puffs into the lungs every 6 (six) hours as needed for wheezing or shortness of breath. 11/19/19  Yes Wurst, [place], PA-C  Ascorbic Acid (VITAMIN C) 500 MG CAPS Take 500 mg by mouth in the morning and at bedtime.    Yes [provider]  atorvastatin (LIPITOR) 40 MG tablet Take 40 mg by mouth at bedtime.   Yes [provider]  cetirizine (ZYRTEC) 10 MG tablet Take 10 mg by mouth daily.   Yes [provider]  cholecalciferol (VITAMIN D) 1000 UNITS tablet Take 1,000 Units by mouth daily.   Yes [provider]  escitalopram (LEXAPRO) 10 MG tablet Take 10 mg by mouth daily. 05/05/23  Yes [provider]  fluticasone (FLONASE) 50 MCG/ACT nasal spray Place 2 sprays into the nose daily as needed for allergies.   Yes [provider]  glimepiride (AMARYL) 1  MG tablet Take 1 mg by mouth 2 (two) times daily with a meal.   Yes [provider]  ketotifen (ALLERGY EYE DROPS) 0.025 % ophthalmic solution Place 1 drop into both eyes daily as needed (allergies).   Yes [provider]  montelukast (SINGULAIR) 10 MG tablet Take 10 mg by mouth daily.    Yes [provider]  zinc gluconate 50 MG tablet Take 50 mg by mouth every evening.   Yes [provider]    Allergies as of 05/12/2023 - Review Complete 09/22/2022  Allergen Reaction Noted   Bee venom Swelling 06/17/2016   Latex Hives  08/09/2012   Penicillins Other (See Comments) 08/09/2012   Vancomycin Hives, Itching, and Nausea And Vomiting 08/09/2012   Doxycycline Swelling and Rash 08/09/2012    Family History  Problem Relation Age of Onset   Cancer Mother        colon    Hypertension Other    Diabetes Other    Stroke Other    Coronary artery disease Other     Social History   Socioeconomic History   Marital status: Married    Spouse name: Not on file   Number of children: Not on file   Years of education: Not on file   Highest education level: Not on file  Occupational History   Not on file  Tobacco Use   Smoking status: Never   Smokeless tobacco: Never  Vaping Use   Vaping status: Never Used  Substance and Sexual Activity   Alcohol use: Yes    Alcohol/week: 1.0 standard drink of alcohol    Types: 1 Glasses of wine per week    Comment: wine   Drug use: No   Sexual activity: Yes    Birth control/protection: Post-menopausal  Other Topics Concern   Not on file  Social History Narrative   Not on file   Social Determinants of Health   Financial Resource Strain: Not on file  Food Insecurity: Not on file  Transportation Needs: Not on file  Physical Activity: Not on file  Stress: Not on file  Social Connections: Not on file  Intimate Partner Violence: Not on file    Review of Systems: See HPI, otherwise negative ROS  Physical Exam: BP 135/78   Pulse 64   Temp 98.4 F (36.9 C) (Oral)   Resp 15   Ht 5' 7.5" (1.715 m)   Wt 66.2 kg   SpO2 98%   BMI 22.53 kg/m  General:   Alert,  Well-developed, well-nourished, pleasant and cooperative in NAD Neck:  Supple; no masses or thyromegaly. No significant cervical adenopathy. Lungs:  Clear throughout to auscultation.   No wheezes, crackles, or rhonchi. No acute distress. Heart:  Regular rate and rhythm; no murmurs, clicks, rubs,  or gallops. Abdomen: Non-distended, normal bowel sounds.  Soft and nontender without appreciable mass or  hepatosplenomegaly.  Pulses:  Normal pulses noted. Extremities:  Without clubbing or edema.  Impression/Plan: 67 year old lady with chronic esophageal dysphagia to food and pills.  History of Ehlers-Danlos syndrome  I have offered the patient an EGD with esophageal dilation is feasible/appropriate per plan.The risks, benefits, limitations, alternatives and imponderables have been reviewed with the patient. Potential for esophageal dilation, biopsy, etc. have also been reviewed.  Questions have been answered. All parties agreeable.      Notice: This dictation was prepared with Dragon dictation along with smaller phrase technology. Any transcriptional errors that result from this process are unintentional and may not  be corrected upon review.

## 2023-06-17 NOTE — Op Note (Signed)
[facility] Patient Name: Christina Aguirre Procedure Date: 06/17/2023 7:05 AM MRN: [id number] Date of Birth: 01/05/1956 Attending MD: Gennette Pac , MD, [id number] CSN: [id number] Age: 67 Admit Type: Outpatient Procedure:                Upper GI endoscopy Indications:              Dysphagia; history of Ehlers-Danlos syndrome Providers:                Gennette Pac, MD, Edrick Kins, RN,                            Zena Amos Referring MD:              Medicines:                Propofol per Anesthesia Complications:            No immediate complications. Estimated Blood Loss:     Estimated blood loss was minimal. Procedure:                Pre-Anesthesia Assessment:                           - Prior to the procedure, a History and Physical                            was performed, and patient medications and                            allergies were reviewed. The patient's tolerance of                            previous anesthesia was also reviewed. The risks                            and benefits of the procedure and the sedation                            options and risks were discussed with the patient.                            All questions were answered, and informed consent                            was obtained. Prior Anticoagulants: The patient has                            taken no anticoagulant or antiplatelet agents. ASA                            Grade Assessment: II - A patient with mild systemic                            disease. After reviewing the risks and benefits,  the patient was deemed in satisfactory condition to                            undergo the procedure.                           After obtaining informed consent, the endoscope was                            passed under direct vision. Throughout the                            procedure, the patient's blood pressure, pulse, and                             oxygen saturations were monitored continuously. The                            GIF-H190 ([id number]) scope was introduced through the                            mouth, and advanced to the second part of duodenum.                            The upper GI endoscopy was accomplished without                            difficulty. The patient tolerated the procedure                            well. Scope In: 7:44:13 AM Scope Out: 7:52:44 AM Total Procedure Duration: 0 hours 8 minutes 31 seconds  Findings:      The examined esophagus was normal.      The entire examined stomach was normal.      The duodenal bulb and second portion of the duodenum were normal. The       scope was withdrawn. Dilation was performed with a Maloney dilator with       no resistance at 50 Fr. The dilation site was examined following       endoscope reinsertion and showed no change. Estimated blood loss: none.       Finally, biopsies of the mid and distal esophagus were taken for       histologic study. Impression:               - Normal esophagus. Dilated (conservatively)                            subsequently biopsied.                           - Normal stomach.                           - Normal duodenal bulb and second portion of the  duodenum. Moderate Sedation:      Moderate (conscious) sedation was personally administered by an       anesthesia professional. The following parameters were monitored: oxygen       saturation, heart rate, blood pressure, respiratory rate, EKG, adequacy       of pulmonary ventilation, and response to care. Recommendation:           - Patient has a contact number available for                            emergencies. The signs and symptoms of potential                            delayed complications were discussed with the                            patient. Return to normal activities tomorrow.                            Written discharge instructions  were provided to the                            patient.                           - Advance diet as tolerated. Follow-up on                            pathology. Office visit in 3 months.                           - Continue present medications.                           - Return to my office in 3 months. Procedure Code(s):        --- Professional ---                           [phone], Esophagogastroduodenoscopy, flexible,                            transoral; diagnostic, including collection of                            specimen(s) by brushing or washing, when performed                            (separate procedure)                           43450, Dilation of esophagus, by unguided sound or                            bougie, single or multiple passes Diagnosis Code(s):        --- Professional ---  R13.10, Dysphagia, unspecified CPT copyright 2022 American Medical Association. All rights reserved. The codes documented in this report are preliminary and upon coder review may  be revised to meet current compliance requirements. Gerrit Friends. Hollee Fate, MD Gennette Pac, MD 06/17/2023 8:24:16 AM This report has been signed electronically. Number of Addenda: 0

## 2023-06-20 ENCOUNTER — Encounter: Payer: Self-pay | Admitting: Internal Medicine

## 2023-06-21 ENCOUNTER — Encounter (HOSPITAL_COMMUNITY): Payer: Self-pay | Admitting: Internal Medicine

## 2023-06-22 NOTE — Anesthesia Postprocedure Evaluation (Signed)
Anesthesia Post Note  Patient: Christina Aguirre  Procedure(s) Performed: ESOPHAGOGASTRODUODENOSCOPY (EGD) WITH PROPOFOL MALONEY DILATION BIOPSY  Patient location during evaluation: Phase II Anesthesia Type: General Level of consciousness: awake Pain management: pain level controlled Vital Signs Assessment: post-procedure vital signs reviewed and stable Respiratory status: spontaneous breathing and respiratory function stable Cardiovascular status: blood pressure returned to baseline and stable Postop Assessment: no headache and no apparent nausea or vomiting Anesthetic complications: no Comments: Late entry   No notable events documented.   Last Vitals:  Vitals:   06/17/23 0646 06/17/23 0800  BP: 135/78 132/69  Pulse: 64 83  Resp: 15 (!) 21  Temp: 36.9 C 36.5 C  SpO2: 98% 96%    Last Pain:  Vitals:   06/17/23 0804  TempSrc:   PainSc: 0-No pain                 Windell Norfolk

## 2023-06-24 DIAGNOSIS — J019 Acute sinusitis, unspecified: Secondary | ICD-10-CM | POA: Diagnosis not present

## 2023-06-24 DIAGNOSIS — R059 Cough, unspecified: Secondary | ICD-10-CM | POA: Diagnosis not present

## 2023-07-16 DIAGNOSIS — E559 Vitamin D deficiency, unspecified: Secondary | ICD-10-CM | POA: Diagnosis not present

## 2023-07-16 DIAGNOSIS — E1169 Type 2 diabetes mellitus with other specified complication: Secondary | ICD-10-CM | POA: Diagnosis not present

## 2023-07-16 DIAGNOSIS — E782 Mixed hyperlipidemia: Secondary | ICD-10-CM | POA: Diagnosis not present

## 2023-07-22 DIAGNOSIS — J302 Other seasonal allergic rhinitis: Secondary | ICD-10-CM | POA: Diagnosis not present

## 2023-07-22 DIAGNOSIS — E1140 Type 2 diabetes mellitus with diabetic neuropathy, unspecified: Secondary | ICD-10-CM | POA: Diagnosis not present

## 2023-07-22 DIAGNOSIS — F330 Major depressive disorder, recurrent, mild: Secondary | ICD-10-CM | POA: Diagnosis not present

## 2023-07-22 DIAGNOSIS — Z86010 Personal history of colonic polyps: Secondary | ICD-10-CM | POA: Diagnosis not present

## 2023-07-22 DIAGNOSIS — M79672 Pain in left foot: Secondary | ICD-10-CM | POA: Diagnosis not present

## 2023-07-22 DIAGNOSIS — M79671 Pain in right foot: Secondary | ICD-10-CM | POA: Diagnosis not present

## 2023-07-22 DIAGNOSIS — E559 Vitamin D deficiency, unspecified: Secondary | ICD-10-CM | POA: Diagnosis not present

## 2023-07-22 DIAGNOSIS — I10 Essential (primary) hypertension: Secondary | ICD-10-CM | POA: Diagnosis not present

## 2023-07-22 DIAGNOSIS — Q7962 Hypermobile Ehlers-Danlos syndrome: Secondary | ICD-10-CM | POA: Diagnosis not present

## 2023-07-22 DIAGNOSIS — R944 Abnormal results of kidney function studies: Secondary | ICD-10-CM | POA: Diagnosis not present

## 2023-07-22 DIAGNOSIS — E875 Hyperkalemia: Secondary | ICD-10-CM | POA: Diagnosis not present

## 2023-07-22 DIAGNOSIS — E1169 Type 2 diabetes mellitus with other specified complication: Secondary | ICD-10-CM | POA: Diagnosis not present

## 2023-07-25 DIAGNOSIS — J019 Acute sinusitis, unspecified: Secondary | ICD-10-CM | POA: Diagnosis not present

## 2023-07-25 DIAGNOSIS — R059 Cough, unspecified: Secondary | ICD-10-CM | POA: Diagnosis not present

## 2023-08-12 ENCOUNTER — Encounter: Payer: Self-pay | Admitting: Gastroenterology

## 2023-08-24 DIAGNOSIS — J019 Acute sinusitis, unspecified: Secondary | ICD-10-CM | POA: Diagnosis not present

## 2023-08-24 DIAGNOSIS — R059 Cough, unspecified: Secondary | ICD-10-CM | POA: Diagnosis not present

## 2023-09-24 DIAGNOSIS — J019 Acute sinusitis, unspecified: Secondary | ICD-10-CM | POA: Diagnosis not present

## 2023-09-24 DIAGNOSIS — R059 Cough, unspecified: Secondary | ICD-10-CM | POA: Diagnosis not present

## 2023-09-28 ENCOUNTER — Encounter: Payer: Self-pay | Admitting: Internal Medicine

## 2023-09-28 ENCOUNTER — Ambulatory Visit: Payer: PPO | Admitting: Internal Medicine

## 2023-09-28 VITALS — BP 133/80 | HR 93 | Temp 98.0°F | Ht 67.5 in | Wt 156.2 lb

## 2023-09-28 DIAGNOSIS — Q796 Ehlers-Danlos syndrome, unspecified: Secondary | ICD-10-CM | POA: Diagnosis not present

## 2023-09-28 DIAGNOSIS — R1310 Dysphagia, unspecified: Secondary | ICD-10-CM

## 2023-09-28 DIAGNOSIS — R1312 Dysphagia, oropharyngeal phase: Secondary | ICD-10-CM

## 2023-09-28 NOTE — Progress Notes (Signed)
done

## 2023-09-28 NOTE — Progress Notes (Addendum)
Primary Care Physician:  Benita Stabile, MD Primary Gastroenterologist:  Dr. Jena Gauss  Pre-Procedure History & Physical: HPI:  Christina Aguirre is a 67 y.o. female here for follow-up of GERD and dysphagia.  Seen previously underwent EGD which revealed a normal-appearing tubular esophagus 50 French Maloney dilator was passed empirically (went conservatively with a history of Ehlers-Danlos syndrome).  On Protonix 40 mg daily.  Patient states esophageal component is improved and she tells me now she has difficulties in initiating a swallow tries to get food chewed up and attempts to get food in the back of her throat to initiate swallowing but has difficulty with liquids and solids.  Incidentally, biopsies of the esophagus negative for EOE.  Past Medical History:  Diagnosis Date   Anemia    Arthritis    Degenerative disc disease    Diabetes mellitus    Ehler's-Danlos syndrome    History of bronchitis    Mitral valve prolapse    echo 20 yr ago   PONV (postoperative nausea and vomiting)    Self-catheterizes urinary bladder    2 times a day as needed    Past Surgical History:  Procedure Laterality Date   ABDOMINAL HYSTERECTOMY     BIOPSY  06/17/2023   Procedure: BIOPSY;  Surgeon: Corbin Ade, MD;  [facility]: [facility];  Service: Endoscopy;;   COLONOSCOPY N/A 10/23/2020   Procedure: COLONOSCOPY;  Surgeon: Corbin Ade, MD;  [facility]: [facility];  Service: Endoscopy;  Laterality: N/A;  1:00   CYSTOCELE REPAIR  08/24/2012   Procedure: ANTERIOR REPAIR (CYSTOCELE);  Surgeon: Lazaro Arms, MD;  Location: [facility];  Service: Gynecology;  Laterality: N/A;  ANTERIOR REPAIR WITH GRAFT   CYSTOSCOPY  08/24/2012   Procedure: CYSTOSCOPY;  Surgeon: Lazaro Arms, MD;  Location: [facility];  Service: Gynecology;  Laterality: N/A;   DENTAL RESTORATION/EXTRACTION WITH X-RAY     June 27   DIAGNOSTIC LAPAROSCOPY     DILATION AND CURETTAGE OF UTERUS     miscarriages   ESOPHAGOGASTRODUODENOSCOPY  (EGD) WITH PROPOFOL N/A 06/17/2023   Procedure: ESOPHAGOGASTRODUODENOSCOPY (EGD) WITH PROPOFOL;  Surgeon: Corbin Ade, MD;  [facility];  Service: Endoscopy;  Laterality: N/A;  7:30 am,asa 2   MALONEY DILATION N/A 06/17/2023   Procedure: Elease Hashimoto DILATION;  Surgeon: Corbin Ade, MD;  [facility];  Service: Endoscopy;  Laterality: N/A;   OPEN REDUCTION INTERNAL FIXATION (ORIF) METACARPAL Left 02/01/2014   Procedure: LEFT RING FINGER FRACTURE OPEN TREATMENT METACARPAL SINGLE INCLUDES INTERNAL FIXATION EACH BONE;  Surgeon: Sheral Apley, MD;  Location: [facility];  Service: Orthopedics;  Laterality: Left;   PUBOVAGINAL SLING  08/24/2012   Procedure: Leonides Grills;  Surgeon: Lazaro Arms, MD;  Location: [facility];  Service: Gynecology;  Laterality: N/A;   TONSILLECTOMY     VAGINAL HYSTERECTOMY  08/24/2012   Procedure: HYSTERECTOMY VAGINAL;  Surgeon: Lazaro Arms, MD;  Location: [facility];  Service: Gynecology;  Laterality: N/A;   VAGINAL PROLAPSE REPAIR  08/24/2012   Procedure: VAGINAL VAULT SUSPENSION;  Surgeon: Lazaro Arms, MD;  Location: [facility];  Service: Gynecology;  Laterality: N/A;    Prior to Admission medications   Medication Sig Start Date End Date Taking? Authorizing Provider  acetaminophen (TYLENOL) 500 MG tablet Take 1,000 mg by mouth daily as needed for mild pain or moderate pain.    Yes [provider]  albuterol (VENTOLIN HFA) 108 (90 Base) MCG/ACT  inhaler Inhale 1-2 puffs into the lungs every 6 (six) hours as needed for wheezing or shortness of breath. 11/19/19  Yes Wurst, [place], PA-C  Ascorbic Acid (VITAMIN C) 500 MG CAPS Take 500 mg by mouth in the morning and at bedtime.    Yes [provider]  atorvastatin (LIPITOR) 40 MG tablet Take 40 mg by mouth at bedtime.   Yes [provider]  cetirizine (ZYRTEC) 10 MG tablet Take 10 mg by mouth daily.   Yes [provider]  cholecalciferol (VITAMIN D)  1000 UNITS tablet Take 1,000 Units by mouth daily.   Yes [provider]  escitalopram (LEXAPRO) 10 MG tablet Take 10 mg by mouth daily. 05/05/23  Yes [provider]  fluticasone (FLONASE) 50 MCG/ACT nasal spray Place 2 sprays into the nose daily as needed for allergies.   Yes [provider]  glimepiride (AMARYL) 1 MG tablet Take 1 mg by mouth 2 (two) times daily with a meal.   Yes [provider]  ketotifen (ALLERGY EYE DROPS) 0.025 % ophthalmic solution Place 1 drop into both eyes daily as needed (allergies).   Yes [provider]  montelukast (SINGULAIR) 10 MG tablet Take 10 mg by mouth daily.    Yes [provider]  pantoprazole (PROTONIX) 40 MG tablet Take 40 mg by mouth daily. 07/22/23  Yes [provider]  zinc gluconate 50 MG tablet Take 50 mg by mouth every evening.   Yes [provider]    Allergies as of 09/28/2023 - Review Complete 09/28/2023  Allergen Reaction Noted   Bee venom Swelling 06/17/2016   Latex Hives 08/09/2012   Penicillins Other (See Comments) 08/09/2012   Vancomycin Hives, Itching, and Nausea And Vomiting 08/09/2012   Doxycycline Swelling and Rash 08/09/2012    Family History  Problem Relation Age of Onset   Cancer Mother        colon    Hypertension Other    Diabetes Other    Stroke Other    Coronary artery disease Other     Social History   Socioeconomic History   Marital status: Married    Spouse name: Not on file   Number of children: Not on file   Years of education: Not on file   Highest education level: Not on file  Occupational History   Not on file  Tobacco Use   Smoking status: Never   Smokeless tobacco: Never  Vaping Use   Vaping status: Never Used  Substance and Sexual Activity   Alcohol use: Yes    Alcohol/week: 1.0 standard drink of alcohol    Types: 1 Glasses of wine per week    Comment: wine   Drug use: No   Sexual activity: Yes    Birth  control/protection: Post-menopausal  Other Topics Concern   Not on file  Social History Narrative   Not on file   Social Determinants of Health   Financial Resource Strain: Not on file  Food Insecurity: Not on file  Transportation Needs: Not on file  Physical Activity: Not on file  Stress: Not on file  Social Connections: Not on file  Intimate Partner Violence: Not on file    Review of Systems: See HPI, otherwise negative ROS  Physical Exam: BP 133/80 (BP Location: Right Arm, Patient Position: Sitting, Cuff Size: Normal)   Pulse 93   Temp 98 F (36.7 C) (Oral)   Ht 5' 7.5" (1.715 m)   Wt 156 lb 3.2 oz (70.9 kg)  SpO2 97%   BMI 24.10 kg/m  General:   Alert,  Well-developed, well-nourished, pleasant and cooperative in NAD  Impression/Plan: Very pleasant 67 year old lady with Ehlers-Danlos syndrome and dysphagia.  She has both symptoms consistent with esophageal and oropharyngeal dysphagia.  Tubular esophagus.  Patent recently.  Some improvement with empiric dilation.  Given the nature of Ehlers-Danlos may have lax connective tissue impeding oropharyngeal swallowing function.  Would also like to make sure that her esophagus is functioning properly prior to a speech therapy swallowing evaluation.  An underlying esophageal motility disorder remains in the differential.  Recommendations:  As discussed, continue Protonix 40 mg daily 30 minutes before breakfast  Need further assessment of functioning of the tubular esophagus.  -  barium pill esophagram to further evaluate.  Depending on the findings of the barium pill esophagram,  may benefit from a speech therapist/swallowing evaluation   Further recommendations to follow.      Notice: This dictation was prepared with Dragon dictation along with smaller phrase technology. Any transcriptional errors that result from this process are unintentional and may not be corrected upon review.

## 2023-09-28 NOTE — Patient Instructions (Signed)
It was good to see you again today!  As discussed, continue Protonix 40 mg daily 30 minutes before breakfast  Need to see if your esophagus is functioning properly.  Therefore, we will obtain a barium pill esophagram to further evaluate the tubular esophagus.  Depending on the findings of the barium pill esophagram you may benefit from a speech therapist/swallowing evaluation which is a separate study.  Further recommendations to follow.

## 2023-10-06 ENCOUNTER — Ambulatory Visit (HOSPITAL_COMMUNITY)
Admission: RE | Admit: 2023-10-06 | Discharge: 2023-10-06 | Disposition: A | Discharge status: Home / Self Care | Payer: PPO | Source: Ambulatory Visit | Attending: Internal Medicine | Admitting: Internal Medicine

## 2023-10-06 DIAGNOSIS — R1312 Dysphagia, oropharyngeal phase: Secondary | ICD-10-CM | POA: Diagnosis not present

## 2023-10-06 DIAGNOSIS — R1310 Dysphagia, unspecified: Secondary | ICD-10-CM | POA: Insufficient documentation

## 2023-10-06 DIAGNOSIS — K224 Dyskinesia of esophagus: Secondary | ICD-10-CM | POA: Diagnosis not present

## 2023-10-13 ENCOUNTER — Other Ambulatory Visit: Payer: Self-pay | Admitting: *Deleted

## 2023-10-13 ENCOUNTER — Encounter: Payer: Self-pay | Admitting: *Deleted

## 2023-10-13 DIAGNOSIS — R1310 Dysphagia, unspecified: Secondary | ICD-10-CM

## 2023-10-13 DIAGNOSIS — K219 Gastro-esophageal reflux disease without esophagitis: Secondary | ICD-10-CM

## 2023-10-20 ENCOUNTER — Other Ambulatory Visit (HOSPITAL_COMMUNITY): Payer: Self-pay | Admitting: Occupational Therapy

## 2023-10-20 DIAGNOSIS — K219 Gastro-esophageal reflux disease without esophagitis: Secondary | ICD-10-CM

## 2023-10-20 DIAGNOSIS — R1312 Dysphagia, oropharyngeal phase: Secondary | ICD-10-CM

## 2023-10-24 DIAGNOSIS — J019 Acute sinusitis, unspecified: Secondary | ICD-10-CM | POA: Diagnosis not present

## 2023-10-24 DIAGNOSIS — R059 Cough, unspecified: Secondary | ICD-10-CM | POA: Diagnosis not present

## 2023-11-16 DIAGNOSIS — E559 Vitamin D deficiency, unspecified: Secondary | ICD-10-CM | POA: Diagnosis not present

## 2023-11-16 DIAGNOSIS — E782 Mixed hyperlipidemia: Secondary | ICD-10-CM | POA: Diagnosis not present

## 2023-11-16 DIAGNOSIS — E1169 Type 2 diabetes mellitus with other specified complication: Secondary | ICD-10-CM | POA: Diagnosis not present

## 2023-11-22 ENCOUNTER — Ambulatory Visit (HOSPITAL_COMMUNITY)
Admission: RE | Admit: 2023-11-22 | Discharge: 2023-11-22 | Disposition: A | Discharge status: Home / Self Care | Payer: PPO | Source: Ambulatory Visit | Attending: Internal Medicine | Admitting: Internal Medicine

## 2023-11-22 ENCOUNTER — Ambulatory Visit (HOSPITAL_COMMUNITY): Payer: PPO | Attending: Internal Medicine | Admitting: Speech Pathology

## 2023-11-22 ENCOUNTER — Encounter (HOSPITAL_COMMUNITY): Payer: Self-pay | Admitting: Speech Pathology

## 2023-11-22 DIAGNOSIS — K219 Gastro-esophageal reflux disease without esophagitis: Secondary | ICD-10-CM | POA: Insufficient documentation

## 2023-11-22 DIAGNOSIS — R1312 Dysphagia, oropharyngeal phase: Secondary | ICD-10-CM | POA: Insufficient documentation

## 2023-11-22 DIAGNOSIS — R1310 Dysphagia, unspecified: Secondary | ICD-10-CM | POA: Diagnosis not present

## 2023-11-22 NOTE — Therapy (Signed)
Modified Barium Swallow Study  Patient Details  Name: Christina Aguirre MRN: [id number] Date of Birth: 1956-11-03  Today's Date: 11/22/2023  HPI/PMH: HPI: Christina Aguirre is a 67 yo female who was referred by Dr. Jena Gauss for MBSS. Per his note: Christina Aguirre is a 67 y.o. female here for follow-up of GERD and dysphagia.  Seen previously underwent EGD which revealed a normal-appearing tubular esophagus 50 French Maloney dilator was passed empirically (went conservatively with a history of Ehlers-Danlos syndrome).  On Protonix 40 mg daily.  Patient states esophageal component is improved and she tells me now she has difficulties in initiating a swallow tries to get food chewed up and attempts to get food in the back of her throat to initiate swallowing but has difficulty with liquids and solids.  Incidentally, biopsies of the esophagus negative for EOE.  R13.10 (ICD-10-CM) - Dysphagia, unspecified type K21.9 (ICD-10-CM) - Gastroesophageal reflux disease, unspecified whether esophagitis present  Barium Swallow 10/06/2023 IMPRESSION: 1.  Mild dysmotility of the lower/distal esophagus   2.  Small sliding-type hiatal hernia  Clinical Impression: Clinical Impression: Oropharyngeal swallow is WNL with prompt swallow trigger and normal hyolarygneal excursion and laryngeal vestibule closure- no penetration or aspiration observed an only trace vallecular residuals noted with puree (Pt with mildly reduced pharyngeal wall constriction and she does appear to have osteophytes along her cervical spine which did not appear to impede pharyngeal space). Esophageal sweep revealed delayed emptying in the distal esophagus with some retrograde movement, but did eventually clear. SLP spoke with Pt at length before and after the study and imaging was reviewed with Pt. Given her diagnosis of Ehler's Danlos, would suspect that her symptoms are related to that. She did not report any TMJ discomfort or fatigue with mastication,  but does report globus sensation. She also describes what sounds like laryngospasms which has occurred at least 3 times in the recent past. The first occurred when she was eating a piece of ice while standing in a driveway and "choked" and felt her airway close (but was not choking on the piece of ice). She also reports that when lying flat in bed, she has difficulty initiating a swallow and has to pick he head up to generate a swallow. Suspect that esophageal dysmotility is causing some of her globus sensation. During the MBSS, she swallowed the barium tablet and it passed to the stomach without delay, however she reported stasis of the pill mid chest. SLP provided Pt with information on ways to mitigate laryngospasms by identifying when it is about to occur and to implement breathing strategies (pursed lip, air sipping, straw breathing, etc) and encouraged her to practice while she is not having symptoms. Recommend regular textures and thin liquids with PO medication whole with water and Pt to keep a journal regarding dysphagia symptoms and laryngospasms. Pt appreciative of information and was given my contact information should she have further questions of concerns.   Factors that may increase risk of adverse event in presence of aspiration Rubye Oaks & Clearance Coots 2021): No data recorded  Recommendations/Plan: Swallowing Evaluation Recommendations Swallowing Evaluation Recommendations Recommendations: PO diet PO Diet Recommendation: Regular; Thin liquids (Level 0) Liquid Administration via: Cup; Straw Medication Administration: Whole meds with liquid Supervision: Patient able to self-feed Postural changes: Stay upright 30-60 min after meals Oral care recommendations: Pt independent with oral care   Assessment: Orofacial Exam: Orofacial Exam Oral Cavity: Oral Hygiene: WFL Oral Cavity - Dentition: Adequate natural dentition Orofacial Anatomy: WFL Oral Motor/Sensory Function: [facility]  Anatomy:   Anatomy: Suspected cervical osteophytes  Boluses Administered: Boluses Administered Boluses Administered: Thin liquids (Level 0); Mildly thick liquids (Level 2, nectar thick); Puree; Solid    Oral Impairment Domain: Oral Impairment Domain Lip Closure: No labial escape Tongue control during bolus hold: Cohesive bolus between tongue to palatal seal Bolus preparation/mastication: Timely and efficient chewing and mashing Bolus transport/lingual motion: Brisk tongue motion Oral residue: Trace residue lining oral structures Location of oral residue : Tongue Initiation of pharyngeal swallow : Posterior angle of the ramus    Pharyngeal Impairment Domain: Pharyngeal Impairment Domain Soft palate elevation: No bolus between soft palate (SP)/pharyngeal wall (PW) Laryngeal elevation: Complete superior movement of thyroid cartilage with complete approximation of arytenoids to epiglottic petiole Anterior hyoid excursion: Complete anterior movement Epiglottic movement: Complete inversion Laryngeal vestibule closure: Complete, no air/contrast in laryngeal vestibule Pharyngeal stripping wave : Present - diminished Pharyngeal contraction (A/P view only): N/A Pharyngoesophageal segment opening: Complete distension and complete duration, no obstruction of flow Tongue base retraction: Trace column of contrast or air between tongue base and PPW Pharyngeal residue: Trace residue within or on pharyngeal structures Location of pharyngeal residue: Valleculae    Esophageal Impairment Domain: Esophageal Impairment Domain Esophageal clearance upright position: Esophageal retention with retrograde flow below pharyngoesophageal segment (PES)   Pill: Pill Consistency administered: Thin liquids (Level 0) Thin liquids (Level 0): [facility]   Penetration/Aspiration Scale Score: Penetration/Aspiration Scale Score 1.  Material does not enter airway: Thin liquids (Level 0); Mildly thick liquids (Level 2, nectar  thick); Puree; Solid; Pill   Compensatory Strategies: Compensatory Strategies Compensatory strategies: No     General Information: Caregiver present: No   Diet Prior to this Study: Regular; Thin liquids (Level 0)    Temperature : Normal    Respiratory Status: WFL    Supplemental O2: None (Room air)    History of Recent Intubation: No   Behavior/Cognition: Alert; Cooperative; Pleasant mood  Self-Feeding Abilities: Able to self-feed  Baseline vocal quality/speech: Normal  Volitional Cough: Able to elicit  Volitional Swallow: Able to elicit  Exam Limitations: No limitations  Pain: Pain Assessment Pain Assessment: No/denies pain   End of Session: Start Time:No data recorded Stop Time: No data recorded Time Calculation:No data recorded Charges: No data recorded SLP visit diagnosis: SLP Visit Diagnosis: Dysphagia, unspecified (R13.10)    Past Medical History:  Past Medical History:  Diagnosis Date   Anemia    Arthritis    Degenerative disc disease    Diabetes mellitus    Ehler's-Danlos syndrome    History of bronchitis    Mitral valve prolapse    echo 20 yr ago   PONV (postoperative nausea and vomiting)    Self-catheterizes urinary bladder    2 times a day as needed   Past Surgical History:  Past Surgical History:  Procedure Laterality Date   ABDOMINAL HYSTERECTOMY     BIOPSY  06/17/2023   Procedure: BIOPSY;  Surgeon: Corbin Ade, MD;  [facility];  Service: Endoscopy;;   COLONOSCOPY N/A 10/23/2020   Procedure: COLONOSCOPY;  Surgeon: Corbin Ade, MD;  [facility];  Service: Endoscopy;  Laterality: N/A;  1:00   CYSTOCELE REPAIR  08/24/2012   Procedure: ANTERIOR REPAIR (CYSTOCELE);  Surgeon: Lazaro Arms, MD;  [facility];  Service: Gynecology;  Laterality: N/A;  ANTERIOR REPAIR WITH GRAFT   CYSTOSCOPY  08/24/2012   Procedure: CYSTOSCOPY;  Surgeon: Lazaro Arms, MD;  [facility];  Service: Gynecology;  Laterality: N/A;   DENTAL RESTORATION/EXTRACTION WITH X-RAY     June 27   DIAGNOSTIC LAPAROSCOPY     DILATION AND CURETTAGE OF UTERUS     miscarriages   ESOPHAGOGASTRODUODENOSCOPY (EGD) WITH PROPOFOL N/A 06/17/2023   Procedure: ESOPHAGOGASTRODUODENOSCOPY (EGD) WITH PROPOFOL;  Surgeon: Corbin Ade, MD;  [facility];  Service: Endoscopy;  Laterality: N/A;  7:30 am,asa 2   MALONEY DILATION N/A 06/17/2023   Procedure: Elease Hashimoto DILATION;  Surgeon: Corbin Ade, MD;  [facility];  Service: Endoscopy;  Laterality: N/A;   OPEN REDUCTION INTERNAL FIXATION (ORIF) METACARPAL Left 02/01/2014   Procedure: LEFT RING FINGER FRACTURE OPEN TREATMENT METACARPAL SINGLE INCLUDES INTERNAL FIXATION EACH BONE;  Surgeon: Sheral Apley, MD;  Location: [facility];  Service: Orthopedics;  Laterality: Left;   PUBOVAGINAL SLING  08/24/2012   Procedure: Leonides Grills;  Surgeon: Lazaro Arms, MD;  [facility];  Service: Gynecology;  Laterality: N/A;   TONSILLECTOMY     VAGINAL HYSTERECTOMY  08/24/2012   Procedure: HYSTERECTOMY VAGINAL;  Surgeon: Lazaro Arms, MD;  [facility];  Service: Gynecology;  Laterality: N/A;   VAGINAL PROLAPSE REPAIR  08/24/2012   Procedure: VAGINAL VAULT SUSPENSION;  Surgeon: Lazaro Arms, MD;  [facility];  Service: Gynecology;  Laterality: N/A;   Thank you,  Havery Moros, CCC-SLP [phone]  Kaysia Willard 11/22/2023, 3:39 PM

## 2023-11-23 ENCOUNTER — Other Ambulatory Visit (HOSPITAL_COMMUNITY): Payer: Self-pay | Admitting: Internal Medicine

## 2023-11-23 DIAGNOSIS — R519 Headache, unspecified: Secondary | ICD-10-CM

## 2023-11-23 DIAGNOSIS — Z0001 Encounter for general adult medical examination with abnormal findings: Secondary | ICD-10-CM | POA: Diagnosis not present

## 2023-11-23 DIAGNOSIS — M79671 Pain in right foot: Secondary | ICD-10-CM | POA: Diagnosis not present

## 2023-11-23 DIAGNOSIS — R944 Abnormal results of kidney function studies: Secondary | ICD-10-CM | POA: Diagnosis not present

## 2023-11-23 DIAGNOSIS — J302 Other seasonal allergic rhinitis: Secondary | ICD-10-CM | POA: Diagnosis not present

## 2023-11-23 DIAGNOSIS — E559 Vitamin D deficiency, unspecified: Secondary | ICD-10-CM | POA: Diagnosis not present

## 2023-11-23 DIAGNOSIS — E875 Hyperkalemia: Secondary | ICD-10-CM | POA: Diagnosis not present

## 2023-11-23 DIAGNOSIS — M8580 Other specified disorders of bone density and structure, unspecified site: Secondary | ICD-10-CM

## 2023-11-23 DIAGNOSIS — I10 Essential (primary) hypertension: Secondary | ICD-10-CM | POA: Diagnosis not present

## 2023-11-23 DIAGNOSIS — M79672 Pain in left foot: Secondary | ICD-10-CM | POA: Diagnosis not present

## 2023-11-23 DIAGNOSIS — E782 Mixed hyperlipidemia: Secondary | ICD-10-CM | POA: Diagnosis not present

## 2023-11-23 DIAGNOSIS — E1169 Type 2 diabetes mellitus with other specified complication: Secondary | ICD-10-CM | POA: Diagnosis not present

## 2023-11-23 DIAGNOSIS — Q7962 Hypermobile Ehlers-Danlos syndrome: Secondary | ICD-10-CM | POA: Diagnosis not present

## 2023-11-23 DIAGNOSIS — F330 Major depressive disorder, recurrent, mild: Secondary | ICD-10-CM | POA: Diagnosis not present

## 2023-11-24 DIAGNOSIS — R059 Cough, unspecified: Secondary | ICD-10-CM | POA: Diagnosis not present

## 2023-11-24 DIAGNOSIS — J019 Acute sinusitis, unspecified: Secondary | ICD-10-CM | POA: Diagnosis not present

## 2023-11-30 DIAGNOSIS — H2513 Age-related nuclear cataract, bilateral: Secondary | ICD-10-CM | POA: Diagnosis not present

## 2023-11-30 DIAGNOSIS — E119 Type 2 diabetes mellitus without complications: Secondary | ICD-10-CM | POA: Diagnosis not present

## 2023-12-02 ENCOUNTER — Ambulatory Visit (HOSPITAL_COMMUNITY)
Admission: RE | Admit: 2023-12-02 | Discharge: 2023-12-02 | Disposition: A | Discharge status: Home / Self Care | Payer: PPO | Source: Ambulatory Visit | Attending: Internal Medicine | Admitting: Internal Medicine

## 2023-12-02 ENCOUNTER — Other Ambulatory Visit (HOSPITAL_COMMUNITY): Payer: Self-pay | Admitting: Internal Medicine

## 2023-12-02 DIAGNOSIS — R519 Headache, unspecified: Secondary | ICD-10-CM

## 2023-12-02 DIAGNOSIS — I6789 Other cerebrovascular disease: Secondary | ICD-10-CM | POA: Diagnosis not present

## 2023-12-02 DIAGNOSIS — M5031 Other cervical disc degeneration,  high cervical region: Secondary | ICD-10-CM | POA: Diagnosis not present

## 2023-12-02 DIAGNOSIS — R413 Other amnesia: Secondary | ICD-10-CM | POA: Diagnosis not present

## 2023-12-02 DIAGNOSIS — J329 Chronic sinusitis, unspecified: Secondary | ICD-10-CM | POA: Insufficient documentation

## 2023-12-02 DIAGNOSIS — I6523 Occlusion and stenosis of bilateral carotid arteries: Secondary | ICD-10-CM | POA: Insufficient documentation

## 2023-12-25 DIAGNOSIS — R059 Cough, unspecified: Secondary | ICD-10-CM | POA: Diagnosis not present

## 2023-12-25 DIAGNOSIS — J019 Acute sinusitis, unspecified: Secondary | ICD-10-CM | POA: Diagnosis not present

## 2024-01-12 ENCOUNTER — Ambulatory Visit: Payer: PPO | Admitting: Gastroenterology

## 2024-01-21 ENCOUNTER — Ambulatory Visit: Payer: PPO | Admitting: Gastroenterology

## 2024-01-22 DIAGNOSIS — R059 Cough, unspecified: Secondary | ICD-10-CM | POA: Diagnosis not present

## 2024-01-22 DIAGNOSIS — J019 Acute sinusitis, unspecified: Secondary | ICD-10-CM | POA: Diagnosis not present

## 2024-02-01 ENCOUNTER — Encounter: Payer: Self-pay | Admitting: Internal Medicine

## 2024-02-01 ENCOUNTER — Ambulatory Visit: Payer: PPO | Admitting: Internal Medicine

## 2024-02-01 VITALS — BP 130/79 | HR 87 | Temp 97.7°F | Ht 67.5 in | Wt 150.8 lb

## 2024-02-01 DIAGNOSIS — R1310 Dysphagia, unspecified: Secondary | ICD-10-CM

## 2024-02-01 DIAGNOSIS — K219 Gastro-esophageal reflux disease without esophagitis: Secondary | ICD-10-CM

## 2024-02-01 NOTE — Progress Notes (Signed)
 Primary Care Physician:  Benita Stabile, MD Primary Gastroenterologist:  Dr. Jena Gauss  Pre-Procedure History & Physical: HPI:  Christina Aguirre is a 68 y.o. female here for follow-up of vague dysphagia.  History of Ehlers-Danlos syndrome.  Some improvement with conservative Maloney dilation previously.  She has undergone a swallowing evaluation with speech therapy.  Her main oropharyngeal issue is if she laughs loud she starts coughing. Reflux controlled on Protonix 40 mg daily not really having any real dysphagia at this time.  Followed by Dr. Araceli Bouche internist who has special interest in Ehlers-Danlos in [facility].  Patient has a history of colonic polyps and positive family history of colon cancer in her mother.  She is due for 5-year surveillance 2026. Past Medical History:  Diagnosis Date   Anemia    Arthritis    Degenerative disc disease    Diabetes mellitus    Ehler's-Danlos syndrome    History of bronchitis    Mitral valve prolapse    echo 20 yr ago   PONV (postoperative nausea and vomiting)    Self-catheterizes urinary bladder    2 times a day as needed    Past Surgical History:  Procedure Laterality Date   ABDOMINAL HYSTERECTOMY     BIOPSY  06/17/2023   Procedure: BIOPSY;  Surgeon: Corbin Ade, MD;  [facility];  Service: Endoscopy;;   COLONOSCOPY N/A 10/23/2020   Procedure: COLONOSCOPY;  Surgeon: Corbin Ade, MD;  [facility];  Service: Endoscopy;  Laterality: N/A;  1:00   CYSTOCELE REPAIR  08/24/2012   Procedure: ANTERIOR REPAIR (CYSTOCELE);  Surgeon: Lazaro Arms, MD;  [facility];  Service: Gynecology;  Laterality: N/A;  ANTERIOR REPAIR WITH GRAFT   CYSTOSCOPY  08/24/2012   Procedure: CYSTOSCOPY;  Surgeon: Lazaro Arms, MD;  [facility];  Service: Gynecology;  Laterality: N/A;   DENTAL RESTORATION/EXTRACTION WITH X-RAY     June 27   DIAGNOSTIC LAPAROSCOPY     DILATION AND CURETTAGE OF UTERUS     miscarriages    ESOPHAGOGASTRODUODENOSCOPY (EGD) WITH PROPOFOL N/A 06/17/2023   Procedure: ESOPHAGOGASTRODUODENOSCOPY (EGD) WITH PROPOFOL;  Surgeon: Corbin Ade, MD;  [facility];  Service: Endoscopy;  Laterality: N/A;  7:30 am,asa 2   MALONEY DILATION N/A 06/17/2023   Procedure: Elease Hashimoto DILATION;  Surgeon: Corbin Ade, MD;  [facility];  Service: Endoscopy;  Laterality: N/A;   OPEN REDUCTION INTERNAL FIXATION (ORIF) METACARPAL Left 02/01/2014   Procedure: LEFT RING FINGER FRACTURE OPEN TREATMENT METACARPAL SINGLE INCLUDES INTERNAL FIXATION EACH BONE;  Surgeon: Sheral Apley, MD;  Location: [facility];  Service: Orthopedics;  Laterality: Left;   PUBOVAGINAL SLING  08/24/2012   Procedure: Leonides Grills;  Surgeon: Lazaro Arms, MD;  [facility];  Service: Gynecology;  Laterality: N/A;   TONSILLECTOMY     VAGINAL HYSTERECTOMY  08/24/2012   Procedure: HYSTERECTOMY VAGINAL;  Surgeon: Lazaro Arms, MD;  [facility];  Service: Gynecology;  Laterality: N/A;   VAGINAL PROLAPSE REPAIR  08/24/2012   Procedure: VAGINAL VAULT SUSPENSION;  Surgeon: Lazaro Arms, MD;  [facility];  Service: Gynecology;  Laterality: N/A;    Prior to Admission medications   Medication Sig Start Date End Date Taking? Authorizing Provider  acetaminophen (TYLENOL) 500 MG tablet Take 1,000 mg by mouth daily as needed for mild pain or moderate pain.    Yes [provider]  albuterol (VENTOLIN  HFA) 108 (90 Base) MCG/ACT inhaler Inhale 1-2 puffs into the lungs every 6 (six) hours as needed for wheezing or shortness of breath. 11/19/19  Yes [place], [place], PA-C  Ascorbic Acid (VITAMIN C) 500 MG CAPS Take 500 mg by mouth in the morning and at bedtime.    Yes [provider]  atorvastatin (LIPITOR) 40 MG tablet Take 40 mg by mouth at bedtime.   Yes [provider]  cetirizine (ZYRTEC) 10 MG tablet Take 10 mg by mouth daily.   Yes [provider]   cholecalciferol (VITAMIN D) 1000 UNITS tablet Take 1,000 Units by mouth daily.   Yes [provider]  escitalopram (LEXAPRO) 10 MG tablet Take 10 mg by mouth daily. 05/05/23  Yes [provider]  FARXIGA 10 MG TABS tablet Take 10 mg by mouth daily. 12/26/23  Yes [provider]  fluticasone (FLONASE) 50 MCG/ACT nasal spray Place 2 sprays into the nose daily as needed for allergies.   Yes [provider]  glimepiride (AMARYL) 1 MG tablet Take 1 mg by mouth 2 (two) times daily with a meal.   Yes [provider]  ketotifen (ALLERGY EYE DROPS) 0.025 % ophthalmic solution Place 1 drop into both eyes daily as needed (allergies).   Yes [provider]  montelukast (SINGULAIR) 10 MG tablet Take 10 mg by mouth daily.    Yes [provider]  pantoprazole (PROTONIX) 40 MG tablet Take 40 mg by mouth daily. 07/22/23  Yes [provider]  zinc gluconate 50 MG tablet Take 50 mg by mouth every evening.   Yes [provider]    Allergies as of 02/01/2024 - Review Complete 02/01/2024  Allergen Reaction Noted   Bee venom Swelling 06/17/2016   Latex Hives 08/09/2012   Penicillins Other (See Comments) 08/09/2012   Vancomycin Hives, Itching, and Nausea And Vomiting 08/09/2012   Doxycycline Swelling and Rash 08/09/2012    Family History  Problem Relation Age of Onset   Cancer Mother        colon    Hypertension Other    Diabetes Other    Stroke Other    Coronary artery disease Other     Social History   Socioeconomic History   Marital status: Married    Spouse name: Not on file   Number of children: Not on file   Years of education: Not on file   Highest education level: Not on file  Occupational History   Not on file  Tobacco Use   Smoking status: Never   Smokeless tobacco: Never  Vaping Use   Vaping status: Never Used  Substance and Sexual Activity   Alcohol use: Yes    Alcohol/week: 1.0 standard drink of alcohol     Types: 1 Glasses of wine per week    Comment: wine   Drug use: No   Sexual activity: Yes    Birth control/protection: Post-menopausal  Other Topics Concern   Not on file  Social History Narrative   Not on file   Social Drivers of Health   Financial Resource Strain: Not on file  Food Insecurity: Not on file  Transportation Needs: Not on file  Physical Activity: Not on file  Stress: Not on file  Social Connections: Not on file  Intimate Partner Violence: Not on file    Review of Systems: See HPI, otherwise negative ROS  Physical Exam: BP 130/79 (BP Location: Right Arm, Patient Position: Sitting, Cuff Size: Normal)   Pulse 87  Temp 97.7 F (36.5 C) (Oral)   Ht 5' 7.5" (1.715 m)   Wt 150 lb 12.8 oz (68.4 kg)   SpO2 93%   BMI 23.27 kg/m  General:   Alert,  Well-developed, well-nourished, pleasant and cooperative in NAD  Impression/Plan: 68 year old lady with Ehlers-Danlos syndrome.  GERD well-controlled on PPI.  Vague oropharyngeal symptoms.  When she laughs, she starts to cough.  Apparently this is consistent with Ehlers-Danlos syndrome.  She is already had a speech/swallowing evaluation.  No recurrent dysphagia at this time.  History of colonic polyps and a positive family history of colon cancer.  Recommendations:  continue taking Protonix 40 mg daily 30 minutes before breakfast.  return in 1 year for surveillance colonoscopy.  We will put  on the callback list.  Should any interim GI issues arise, pt to call.       Notice: This dictation was prepared with Dragon dictation along with smaller phrase technology. Any transcriptional errors that result from this process are unintentional and may not be corrected upon review.

## 2024-02-01 NOTE — Patient Instructions (Signed)
 It was good to see you again today  I recommend you continue taking Protonix 40 mg daily 30 minutes before breakfast.  You should return in 1 year for surveillance colonoscopy.  We will put you on the callback list.  Should any interim GI issues arise, please do not hesitate to call me

## 2024-02-21 DIAGNOSIS — E782 Mixed hyperlipidemia: Secondary | ICD-10-CM | POA: Diagnosis not present

## 2024-02-21 DIAGNOSIS — E559 Vitamin D deficiency, unspecified: Secondary | ICD-10-CM | POA: Diagnosis not present

## 2024-02-21 DIAGNOSIS — E1169 Type 2 diabetes mellitus with other specified complication: Secondary | ICD-10-CM | POA: Diagnosis not present

## 2024-03-07 ENCOUNTER — Telehealth: Payer: Self-pay

## 2024-03-07 NOTE — Progress Notes (Signed)
   03/07/2024  Patient ID: Christina Aguirre, female   DOB: [birth year], 68 y.o.   MRN: [id number]   Pharmacy Quality Measure Review  This patient is appearing on a report for being at risk of failing the Glycemic Status Assessment in Diabetes measure this calendar year.   Last documented BP 132/80 on 11/23/23  Last documented A1c or GMI 7.7 on 02/21/24 down from 8.4 on 11/15/22  Called patient to schedule a visit, unsuccessful outreach so I left a voicemail to return my call. Next PCP visit is on 03/20/24 at 11:50 am. I will coordinate with PCP to arrange a same day visit if patient agrees.  Flint Hummer, PharmD

## 2024-03-15 ENCOUNTER — Telehealth: Payer: Self-pay

## 2024-03-15 NOTE — Progress Notes (Signed)
   03/15/2024  Patient ID: Christina Aguirre, female   DOB: 30-May-1956, 68 y.o.   MRN: [id number]   Initial Visit Scheduling  Contacted Christina Aguirre, discussed recent A1C and LDL which have improved but are not at goal. Provided counseling on her medications Farxiga and atorvastatin (she had side effects with both) and suggested alternative to atorvastatin or addition of ezetimibe to improve cholesterol. She declined both. Also discussed CGM to monitor blood glucose, she was not interested at the time. Encouraged her to refill glimepride since she is overdue for another fill. Feels she's doing well and did not agree to a visit with me.   Flint Hummer, PharmD

## 2024-03-20 DIAGNOSIS — R944 Abnormal results of kidney function studies: Secondary | ICD-10-CM | POA: Diagnosis not present

## 2024-03-20 DIAGNOSIS — E782 Mixed hyperlipidemia: Secondary | ICD-10-CM | POA: Diagnosis not present

## 2024-03-20 DIAGNOSIS — F330 Major depressive disorder, recurrent, mild: Secondary | ICD-10-CM | POA: Diagnosis not present

## 2024-03-20 DIAGNOSIS — E1169 Type 2 diabetes mellitus with other specified complication: Secondary | ICD-10-CM | POA: Diagnosis not present

## 2024-03-20 DIAGNOSIS — J302 Other seasonal allergic rhinitis: Secondary | ICD-10-CM | POA: Diagnosis not present

## 2024-03-20 DIAGNOSIS — I10 Essential (primary) hypertension: Secondary | ICD-10-CM | POA: Diagnosis not present

## 2024-03-20 DIAGNOSIS — Q7962 Hypermobile Ehlers-Danlos syndrome: Secondary | ICD-10-CM | POA: Diagnosis not present

## 2024-03-20 DIAGNOSIS — E1140 Type 2 diabetes mellitus with diabetic neuropathy, unspecified: Secondary | ICD-10-CM | POA: Diagnosis not present

## 2024-03-20 DIAGNOSIS — E559 Vitamin D deficiency, unspecified: Secondary | ICD-10-CM | POA: Diagnosis not present

## 2024-03-20 DIAGNOSIS — E875 Hyperkalemia: Secondary | ICD-10-CM | POA: Diagnosis not present

## 2024-03-20 DIAGNOSIS — M79671 Pain in right foot: Secondary | ICD-10-CM | POA: Diagnosis not present

## 2024-03-20 DIAGNOSIS — Z860100 Personal history of colon polyps, unspecified: Secondary | ICD-10-CM | POA: Diagnosis not present

## 2024-05-18 ENCOUNTER — Ambulatory Visit: Attending: Internal Medicine

## 2024-05-18 ENCOUNTER — Encounter: Payer: Self-pay | Admitting: Internal Medicine

## 2024-05-18 ENCOUNTER — Other Ambulatory Visit: Payer: Self-pay | Admitting: Internal Medicine

## 2024-05-18 ENCOUNTER — Telehealth: Payer: Self-pay | Admitting: Internal Medicine

## 2024-05-18 ENCOUNTER — Ambulatory Visit: Attending: Internal Medicine | Admitting: Internal Medicine

## 2024-05-18 VITALS — BP 116/76 | HR 86 | Ht 67.5 in | Wt 151.0 lb

## 2024-05-18 DIAGNOSIS — R002 Palpitations: Secondary | ICD-10-CM

## 2024-05-18 DIAGNOSIS — I10 Essential (primary) hypertension: Secondary | ICD-10-CM | POA: Diagnosis not present

## 2024-05-18 DIAGNOSIS — Z8679 Personal history of other diseases of the circulatory system: Secondary | ICD-10-CM

## 2024-05-18 NOTE — Patient Instructions (Addendum)
 Medication Instructions:  Your physician recommends that you continue on your current medications as directed. Please refer to the Current Medication list given to you today.  Labwork: None   Testing/Procedures: Non-Cardiac CT Angiography (CTA), is a special type of CT scan that uses a computer to produce multi-dimensional views of major blood vessels throughout the body. In CT angiography, a contrast material is injected through an IV to help visualize the blood vessels  Your physician has requested that you have an echocardiogram. Echocardiography is a painless test that uses sound waves to create images of your heart. It provides your doctor with information about the size and shape of your heart and how well your heart's chambers and valves are working. This procedure takes approximately one hour. There are no restrictions for this procedure. Please do NOT wear cologne, perfume, aftershave, or lotions (deodorant is allowed). Please arrive 15 minutes prior to your appointment time.  Please note: We ask at that you not bring children with you during ultrasound (echo/ vascular) testing. Due to room size and safety concerns, children are not allowed in the ultrasound rooms during exams. Our front office staff cannot provide observation of children in our lobby area while testing is being conducted. An adult accompanying a patient to their appointment will only be allowed in the ultrasound room at the discretion of the ultrasound technician under special circumstances. We apologize for any inconvenience. ZIO- Long Term Monitor Instructions   Your physician has requested you wear your ZIO patch monitor 14 days.   This is a single patch monitor.  [employer] supplies one patch monitor per enrollment.  Additional stickers are not available.   Please do not apply patch if you will be having a Nuclear Stress Test, Echocardiogram, Cardiac CT, MRI, or Chest Xray during the time frame you would be wearing the  monitor. The patch cannot be worn during these tests.  You cannot remove and re-apply the ZIO XT patch monitor.   Your ZIO patch monitor will be sent USPS Priority mail from [facility] directly to your home address. The monitor may also be mailed to a PO BOX if home delivery is not available.   It may take 3-5 days to receive your monitor after you have been enrolled.   Once you have received you monitor, please review enclosed instructions.  Your monitor has already been registered assigning a specific monitor serial # to you.   Applying the monitor   Shave hair from upper left chest.   Hold abrader disc by orange tab.  Rub abrader in 40 strokes over left upper chest as indicated in your monitor instructions.   Clean area with 4 enclosed alcohol pads .  Use all pads to assure are is cleaned thoroughly.  Let dry.   Apply patch as indicated in monitor instructions.  Patch will be place under collarbone on left side of chest with arrow pointing upward.   Rub patch adhesive wings for 2 minutes.Remove white label marked 1.  Remove white label marked 2.  Rub patch adhesive wings for 2 additional minutes.   While looking in a mirror, press and release button in center of patch.  A small green light will flash 3-4 times .  This will be your only indicator the monitor has been turned on.     Do not shower for the first 24 hours.  You may shower after the first 24 hours.   Press button if you feel a symptom. You will hear a  small click.  Record Date, Time and Symptom in the Patient Log Book.   When you are ready to remove patch, follow instructions on last 2 pages of Patient Log Book.  Stick patch monitor onto last page of Patient Log Book.   Place Patient Log Book in [employer] box.  Use locking tab on box and tape box closed securely.  The [employer] and [employer] has [employer] on it.  Please place in mailbox as soon as possible.  Your physician should have your test results  approximately 7 days after the monitor has been mailed back to [facility]   Call [facility] Customer Care at [phone] if you have questions regarding your ZIO XT patch monitor.  Call them immediately if you see an orange light blinking on your monitor.   If your monitor falls off in less than 4 days contact our Monitor department at [phone].  If your monitor becomes loose or falls off after 4 days call [employer] at [phone] for suggestions on securing your monitor.  Follow-Up: Your physician recommends that you schedule a follow-up appointment in: 6 Months   Any Other Special Instructions Will Be Listed Below (If Applicable).  If you need a refill on your cardiac medications before your next appointment, please call your pharmacy.

## 2024-05-18 NOTE — Telephone Encounter (Signed)
 Checking percert on the following   LONG TERM MONITOR (3-14 DAYS)

## 2024-05-18 NOTE — Telephone Encounter (Signed)
 Checking percert on the following patient for testing scheduled at [facility].    CT ANGIO CHEST AORTA W/CM &/OR   06/09/2024

## 2024-05-18 NOTE — Progress Notes (Signed)
 Cardiology Office Note  Date: 05/18/2024   ID: Robbi, Spells [birth year], MRN [id number]  PCP:  Shona Norleen PEDLAR, MD  Cardiologist:  None Electrophysiologist:  None   History of Present Illness: Christina Aguirre is a 68 y.o. female  Referred to cardiology clinic for evaluation of palpitations.  Ongoing palpitations for quite a while.  Occurs few times a week.  Last between 1 minute and 5 minutes.  She drinks 1 cup of coffee sometimes and herbal tea daily in the evening.  She does not think this is from tea.  No syncope or leg swelling.  She gets chest pains once in a while.  She has short of breath when she gets tired.  But able to do her daily activities and household chores with no issues.  Past Medical History:  Diagnosis Date   Anemia    Arthritis    Degenerative disc disease    Diabetes mellitus    Ehler's-Danlos syndrome    History of bronchitis    Mitral valve prolapse    echo 20 yr ago   PONV (postoperative nausea and vomiting)    Self-catheterizes urinary bladder    2 times a day as needed    Past Surgical History:  Procedure Laterality Date   ABDOMINAL HYSTERECTOMY     BIOPSY  06/17/2023   Procedure: BIOPSY;  Surgeon: Shaaron Lamar CHRISTELLA, MD;  [facility];  Service: Endoscopy;;   COLONOSCOPY N/A 10/23/2020   Procedure: COLONOSCOPY;  Surgeon: Shaaron Lamar CHRISTELLA, MD;  [facility];  Service: Endoscopy;  Laterality: N/A;  1:00   CYSTOCELE REPAIR  08/24/2012   Procedure: ANTERIOR REPAIR (CYSTOCELE);  Surgeon: Vonn VEAR Inch, MD;  Location: [facility];  Service: Gynecology;  Laterality: N/A;  ANTERIOR REPAIR WITH GRAFT   CYSTOSCOPY  08/24/2012   Procedure: CYSTOSCOPY;  Surgeon: Vonn VEAR Inch, MD;  Location: [facility];  Service: Gynecology;  Laterality: N/A;   DENTAL RESTORATION/EXTRACTION WITH X-RAY     June 27   DIAGNOSTIC LAPAROSCOPY     DILATION AND CURETTAGE OF UTERUS     miscarriages   ESOPHAGOGASTRODUODENOSCOPY (EGD) WITH PROPOFOL  N/A 06/17/2023    Procedure: ESOPHAGOGASTRODUODENOSCOPY (EGD) WITH PROPOFOL ;  Surgeon: Shaaron Lamar CHRISTELLA, MD;  [facility];  Service: Endoscopy;  Laterality: N/A;  7:30 am,asa 2   MALONEY DILATION N/A 06/17/2023   Procedure: AGAPITO DILATION;  Surgeon: Shaaron Lamar CHRISTELLA, MD;  [facility];  Service: Endoscopy;  Laterality: N/A;   OPEN REDUCTION INTERNAL FIXATION (ORIF) METACARPAL Left 02/01/2014   Procedure: LEFT RING FINGER FRACTURE OPEN TREATMENT METACARPAL SINGLE INCLUDES INTERNAL FIXATION EACH BONE;  Surgeon: Evalene JONETTA Chancy, MD;  Location: [facility];  Service: Orthopedics;  Laterality: Left;   PUBOVAGINAL SLING  08/24/2012   Procedure: CARLOYN GLADE;  Surgeon: Vonn VEAR Inch, MD;  Location: [facility];  Service: Gynecology;  Laterality: N/A;   TONSILLECTOMY     VAGINAL HYSTERECTOMY  08/24/2012   Procedure: HYSTERECTOMY VAGINAL;  Surgeon: Vonn VEAR Inch, MD;  [facility]: [facility];  Service: Gynecology;  Laterality: N/A;   VAGINAL PROLAPSE REPAIR  08/24/2012   Procedure: VAGINAL VAULT SUSPENSION;  Surgeon: Vonn VEAR Inch, MD;  Location: [facility];  Service: Gynecology;  Laterality: N/A;    Current Outpatient Medications  Medication Sig Dispense Refill   acetaminophen  (TYLENOL ) 500 MG tablet Take 1,000 mg by mouth daily as needed for mild pain or moderate pain.      albuterol  (  VENTOLIN  HFA) 108 (90 Base) MCG/ACT inhaler Inhale 1-2 puffs into the lungs every 6 (six) hours as needed for wheezing or shortness of breath. 18 g 0   atorvastatin (LIPITOR) 40 MG tablet Take 40 mg by mouth at bedtime.     Berberine Chloride 500 MG CAPS Take 1,500 mg by mouth in the morning and at bedtime.     cetirizine (ZYRTEC) 10 MG tablet Take 10 mg by mouth daily.     cholecalciferol (VITAMIN D) 1000 UNITS tablet Take 1,000 Units by mouth daily.     escitalopram (LEXAPRO) 10 MG tablet Take 10 mg by mouth daily.     FARXIGA 10 MG TABS tablet Take 10 mg by mouth daily.     fluticasone  (FLONASE ) 50  MCG/ACT nasal spray Place 2 sprays into the nose daily as needed for allergies.     glimepiride  (AMARYL ) 1 MG tablet Take 1 mg by mouth 2 (two) times daily with a meal.     ketotifen (ALLERGY EYE DROPS) 0.025 % ophthalmic solution Place 1 drop into both eyes daily as needed (allergies).     montelukast (SINGULAIR) 10 MG tablet Take 10 mg by mouth daily.      pantoprazole (PROTONIX) 40 MG tablet Take 40 mg by mouth daily.     zinc gluconate 50 MG tablet Take 50 mg by mouth every evening.     No current facility-administered medications for this visit.   Allergies:  Bee venom, Latex, Penicillins, Vancomycin, and Doxycycline   Social History: The patient  reports that she has never smoked. She has never used smokeless tobacco. She reports current alcohol use of about 1.0 standard drink of alcohol per week. She reports that she does not use drugs.   Family History: The patient's family history includes Cancer in her mother; Coronary artery disease in an other family member; Diabetes in an other family member; Hypertension in an other family member; Stroke in an other family member.   ROS:  Please see the history of present illness. Otherwise, complete review of systems is positive for none  All other systems are reviewed and negative.   Physical Exam: VS:  BP 116/76   Pulse 86   Ht 5' 7.5 (1.715 m)   Wt 151 lb (68.5 kg)   SpO2 98%   BMI 23.30 kg/m , BMI Body mass index is 23.3 kg/m.  Wt Readings from Last 3 Encounters:  05/18/24 151 lb (68.5 kg)  02/01/24 150 lb 12.8 oz (68.4 kg)  09/28/23 156 lb 3.2 oz (70.9 kg)    General: Patient appears comfortable at rest. HEENT: Conjunctiva and lids normal, oropharynx clear with moist mucosa. Neck: Supple, no elevated JVP or carotid bruits, no thyromegaly. Lungs: Clear to auscultation, nonlabored breathing at rest. Cardiac: Regular rate and rhythm, no S3 or significant systolic murmur, no pericardial rub. Abdomen: Soft, nontender, no  hepatomegaly, bowel sounds present, no guarding or rebound. Extremities: No pitting edema, distal pulses 2+. Skin: Warm and dry. Musculoskeletal: No kyphosis. Neuropsychiatric: Alert and oriented x3, affect grossly appropriate.  Recent Labwork: No results found for requested labs within last 365 days.  No results found for: CHOL, TRIG, HDL, CHOLHDL, VLDL, LDLCALC, LDLDIRECT   Assessment and Plan:   Palpitations: Ongoing palpitations for quite a while, occurs few times a week and last for about 1 minute to 5 minutes.  Obtain 2-week event monitor, NL.  Drinks 1 cup of coffee sometimes and takes herbal tea daily.  History mitral valve prolapse: No  murmur on physical exam.  Obtain echocardiogram.  History of Ehler Danlos syndrome: Patient and her sister were diagnosed with Ehlers-Danlos syndrome in the past. Patient's sister passed away few years ago with brain aneurysm.  Patient underwent MRI brain recently and was noted to have no aneurysms.  Will obtain CTA chest/aorta to rule out any aorta pathology.      Medication Adjustments/Labs and Tests Ordered: Current medicines are reviewed at length with the patient today.  Concerns regarding medicines are outlined above.    Disposition:  Follow up 6 months  Signed Kamrin Spath Priya Monico Sudduth, MD, 05/18/2024 12:29 PM    [facility] [address] [place], [place], [place] [address]

## 2024-05-22 ENCOUNTER — Encounter: Payer: Self-pay | Admitting: *Deleted

## 2024-05-22 ENCOUNTER — Other Ambulatory Visit: Payer: Self-pay | Admitting: *Deleted

## 2024-05-22 DIAGNOSIS — Z01812 Encounter for preprocedural laboratory examination: Secondary | ICD-10-CM

## 2024-05-22 DIAGNOSIS — I10 Essential (primary) hypertension: Secondary | ICD-10-CM

## 2024-06-05 ENCOUNTER — Other Ambulatory Visit (HOSPITAL_COMMUNITY)
Admission: RE | Admit: 2024-06-05 | Discharge: 2024-06-05 | Disposition: A | Discharge status: Home / Self Care | Source: Ambulatory Visit | Attending: Internal Medicine | Admitting: Internal Medicine

## 2024-06-05 DIAGNOSIS — Z01812 Encounter for preprocedural laboratory examination: Secondary | ICD-10-CM | POA: Diagnosis not present

## 2024-06-05 DIAGNOSIS — I10 Essential (primary) hypertension: Secondary | ICD-10-CM | POA: Insufficient documentation

## 2024-06-05 LAB — BASIC METABOLIC PANEL WITH GFR
Anion gap: 9 (ref 5–15)
BUN: 21 mg/dL (ref 8–23)
CO2: 25 mmol/L (ref 22–32)
Calcium: 9.2 mg/dL (ref 8.9–10.3)
Chloride: 103 mmol/L (ref 98–111)
Creatinine, Ser: 0.96 mg/dL (ref 0.44–1.00)
GFR, Estimated: >60 mL/min (ref >60)
Glucose, Bld: 123 mg/dL — ABNORMAL HIGH (ref 70–99)
Potassium: 4 mmol/L (ref 3.5–5.1)
Sodium: 137 mmol/L (ref 135–145)

## 2024-06-07 ENCOUNTER — Encounter: Payer: Self-pay | Admitting: Internal Medicine

## 2024-06-07 DIAGNOSIS — R002 Palpitations: Secondary | ICD-10-CM | POA: Diagnosis not present

## 2024-06-09 ENCOUNTER — Ambulatory Visit (HOSPITAL_COMMUNITY)
Admission: RE | Admit: 2024-06-09 | Discharge: 2024-06-09 | Disposition: A | Discharge status: Home / Self Care | Source: Ambulatory Visit | Attending: Internal Medicine | Admitting: Internal Medicine

## 2024-06-09 DIAGNOSIS — D171 Benign lipomatous neoplasm of skin and subcutaneous tissue of trunk: Secondary | ICD-10-CM | POA: Diagnosis not present

## 2024-06-09 DIAGNOSIS — Z8679 Personal history of other diseases of the circulatory system: Secondary | ICD-10-CM | POA: Insufficient documentation

## 2024-06-09 DIAGNOSIS — E042 Nontoxic multinodular goiter: Secondary | ICD-10-CM | POA: Diagnosis not present

## 2024-06-09 DIAGNOSIS — I2510 Atherosclerotic heart disease of native coronary artery without angina pectoris: Secondary | ICD-10-CM | POA: Diagnosis not present

## 2024-06-09 MED ORDER — IOHEXOL 350 MG/ML SOLN: 80.0000 mL | Freq: Once | INTRAVENOUS | Status: DC | PRN

## 2024-06-09 MED ORDER — IOHEXOL 350 MG/ML SOLN
100.0000 mL | Freq: Once | INTRAVENOUS | Status: AC | PRN
  Administered 2024-06-09: 100 mL via INTRAVENOUS

## 2024-06-12 ENCOUNTER — Ambulatory Visit: Attending: Internal Medicine

## 2024-06-12 DIAGNOSIS — R002 Palpitations: Secondary | ICD-10-CM

## 2024-06-12 LAB — ECHOCARDIOGRAM COMPLETE
AR max vel: 3.4 cm2
AV Area VTI: 3.36 cm2
AV Area mean vel: 3.52 cm2
AV Mean grad: 4 mmHg
AV Peak grad: 6.9 mmHg
Ao pk vel: 1.31 m/s
Area-P 1/2: 2.38 cm2
Calc EF: 63.8 %
MV VTI: 4.08 cm2
S' Lateral: 2.5 cm
Single Plane A2C EF: 62.2 %
Single Plane A4C EF: 63.4 %

## 2024-06-13 ENCOUNTER — Ambulatory Visit: Payer: Self-pay | Admitting: Internal Medicine

## 2024-06-19 ENCOUNTER — Other Ambulatory Visit (HOSPITAL_COMMUNITY): Payer: Self-pay | Admitting: Internal Medicine

## 2024-06-19 DIAGNOSIS — F330 Major depressive disorder, recurrent, mild: Secondary | ICD-10-CM | POA: Diagnosis not present

## 2024-06-19 DIAGNOSIS — J302 Other seasonal allergic rhinitis: Secondary | ICD-10-CM | POA: Diagnosis not present

## 2024-06-19 DIAGNOSIS — K869 Disease of pancreas, unspecified: Secondary | ICD-10-CM | POA: Diagnosis not present

## 2024-06-19 DIAGNOSIS — R002 Palpitations: Secondary | ICD-10-CM | POA: Diagnosis not present

## 2024-06-19 DIAGNOSIS — Z79899 Other long term (current) drug therapy: Secondary | ICD-10-CM | POA: Diagnosis not present

## 2024-06-19 DIAGNOSIS — E041 Nontoxic single thyroid nodule: Secondary | ICD-10-CM | POA: Diagnosis not present

## 2024-06-19 DIAGNOSIS — Z7182 Exercise counseling: Secondary | ICD-10-CM | POA: Diagnosis not present

## 2024-06-19 DIAGNOSIS — I10 Essential (primary) hypertension: Secondary | ICD-10-CM | POA: Diagnosis not present

## 2024-06-19 DIAGNOSIS — Q7962 Hypermobile Ehlers-Danlos syndrome: Secondary | ICD-10-CM | POA: Diagnosis not present

## 2024-06-19 DIAGNOSIS — Z713 Dietary counseling and surveillance: Secondary | ICD-10-CM | POA: Diagnosis not present

## 2024-06-27 ENCOUNTER — Other Ambulatory Visit (HOSPITAL_COMMUNITY): Payer: Self-pay | Admitting: Internal Medicine

## 2024-06-27 ENCOUNTER — Ambulatory Visit (HOSPITAL_COMMUNITY)
Admission: RE | Admit: 2024-06-27 | Discharge: 2024-06-27 | Disposition: A | Discharge status: Home / Self Care | Source: Ambulatory Visit | Attending: Internal Medicine | Admitting: Internal Medicine

## 2024-06-27 DIAGNOSIS — E041 Nontoxic single thyroid nodule: Secondary | ICD-10-CM | POA: Insufficient documentation

## 2024-06-27 DIAGNOSIS — K862 Cyst of pancreas: Secondary | ICD-10-CM | POA: Diagnosis not present

## 2024-06-27 DIAGNOSIS — E042 Nontoxic multinodular goiter: Secondary | ICD-10-CM | POA: Diagnosis not present

## 2024-06-27 DIAGNOSIS — K869 Disease of pancreas, unspecified: Secondary | ICD-10-CM | POA: Insufficient documentation

## 2024-06-27 DIAGNOSIS — N281 Cyst of kidney, acquired: Secondary | ICD-10-CM | POA: Diagnosis not present

## 2024-06-27 MED ORDER — GADOBUTROL 1 MMOL/ML IV SOLN
7.0000 mL | Freq: Once | INTRAVENOUS | Status: AC | PRN
  Administered 2024-06-27: 7 mL via INTRAVENOUS

## 2024-06-29 ENCOUNTER — Other Ambulatory Visit: Payer: Self-pay | Admitting: Internal Medicine

## 2024-06-29 DIAGNOSIS — E041 Nontoxic single thyroid nodule: Secondary | ICD-10-CM

## 2024-07-10 DIAGNOSIS — E782 Mixed hyperlipidemia: Secondary | ICD-10-CM | POA: Diagnosis not present

## 2024-07-10 DIAGNOSIS — E559 Vitamin D deficiency, unspecified: Secondary | ICD-10-CM | POA: Diagnosis not present

## 2024-07-10 DIAGNOSIS — E1169 Type 2 diabetes mellitus with other specified complication: Secondary | ICD-10-CM | POA: Diagnosis not present

## 2024-07-12 ENCOUNTER — Encounter: Payer: Self-pay | Admitting: Internal Medicine

## 2024-07-15 ENCOUNTER — Encounter: Payer: Self-pay | Admitting: Internal Medicine

## 2024-07-18 DIAGNOSIS — R944 Abnormal results of kidney function studies: Secondary | ICD-10-CM | POA: Diagnosis not present

## 2024-07-18 DIAGNOSIS — E782 Mixed hyperlipidemia: Secondary | ICD-10-CM | POA: Diagnosis not present

## 2024-07-18 DIAGNOSIS — K869 Disease of pancreas, unspecified: Secondary | ICD-10-CM | POA: Diagnosis not present

## 2024-07-18 DIAGNOSIS — R5383 Other fatigue: Secondary | ICD-10-CM | POA: Diagnosis not present

## 2024-07-18 DIAGNOSIS — R002 Palpitations: Secondary | ICD-10-CM | POA: Diagnosis not present

## 2024-07-18 DIAGNOSIS — J302 Other seasonal allergic rhinitis: Secondary | ICD-10-CM | POA: Diagnosis not present

## 2024-07-18 DIAGNOSIS — L299 Pruritus, unspecified: Secondary | ICD-10-CM | POA: Diagnosis not present

## 2024-07-18 DIAGNOSIS — I10 Essential (primary) hypertension: Secondary | ICD-10-CM | POA: Diagnosis not present

## 2024-07-18 DIAGNOSIS — F330 Major depressive disorder, recurrent, mild: Secondary | ICD-10-CM | POA: Diagnosis not present

## 2024-07-18 DIAGNOSIS — E1169 Type 2 diabetes mellitus with other specified complication: Secondary | ICD-10-CM | POA: Diagnosis not present

## 2024-07-18 DIAGNOSIS — Q7962 Hypermobile Ehlers-Danlos syndrome: Secondary | ICD-10-CM | POA: Diagnosis not present

## 2024-07-18 DIAGNOSIS — E875 Hyperkalemia: Secondary | ICD-10-CM | POA: Diagnosis not present

## 2024-08-04 ENCOUNTER — Ambulatory Visit: Payer: Self-pay | Admitting: Internal Medicine

## 2024-08-07 ENCOUNTER — Inpatient Hospital Stay: Admission: RE | Admit: 2024-08-07 | Source: Ambulatory Visit

## 2024-08-10 ENCOUNTER — Ambulatory Visit
Admission: RE | Admit: 2024-08-10 | Discharge: 2024-08-10 | Disposition: A | Discharge status: Home / Self Care | Source: Ambulatory Visit | Attending: Internal Medicine | Admitting: Internal Medicine

## 2024-08-10 ENCOUNTER — Other Ambulatory Visit (HOSPITAL_COMMUNITY)
Admission: RE | Admit: 2024-08-10 | Discharge: 2024-08-10 | Disposition: A | Discharge status: Home / Self Care | Source: Ambulatory Visit | Attending: Internal Medicine | Admitting: Internal Medicine

## 2024-08-10 DIAGNOSIS — E041 Nontoxic single thyroid nodule: Secondary | ICD-10-CM | POA: Diagnosis not present

## 2024-08-14 LAB — CYTOLOGY - NON PAP

## 2024-08-22 ENCOUNTER — Encounter: Payer: Self-pay | Admitting: Internal Medicine

## 2024-08-22 ENCOUNTER — Ambulatory Visit: Admitting: Internal Medicine

## 2024-08-22 VITALS — BP 125/73 | HR 78 | Temp 98.7°F | Ht 67.5 in | Wt 146.2 lb

## 2024-08-22 DIAGNOSIS — K219 Gastro-esophageal reflux disease without esophagitis: Secondary | ICD-10-CM | POA: Diagnosis not present

## 2024-08-22 DIAGNOSIS — R1310 Dysphagia, unspecified: Secondary | ICD-10-CM

## 2024-08-22 DIAGNOSIS — Z860100 Personal history of colon polyps, unspecified: Secondary | ICD-10-CM | POA: Diagnosis not present

## 2024-08-22 DIAGNOSIS — K862 Cyst of pancreas: Secondary | ICD-10-CM

## 2024-08-22 DIAGNOSIS — Z800 Family history of malignant neoplasm of digestive organs: Secondary | ICD-10-CM

## 2024-08-22 NOTE — Patient Instructions (Signed)
 It was nice to see you today!  As discussed the cysts in your liver have benign characteristics.  These are well-known to occur.  Only if they enlarge significantly would further evaluation be warranted.  At this time, we do not need to do anything else.  Repeat MRI in 2 years  Continue Protonix 40 mg daily  You will be due for a colonoscopy a year from now.  We will see you back in 1 year and set up a colonoscopy  If you have any interim problems please do not hesitate to reach out

## 2024-08-22 NOTE — Progress Notes (Signed)
 Gastroenterology Progress Note    Primary Care Physician:  Shona Norleen PEDLAR, MD Primary Gastroenterologist:  Dr. Shaaron  Pre-Procedure History & Physical: HPI:  Christina Aguirre is a 68 y.o. female here for for a pancreatic cyst found on a CT at the time a CTA chest when she presented with palpitations earlier this year.  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 -  largest measuring 15 mm nothing else; it was recommended she come back in 2 years for an MRI she does not have any pancreatic issues no abdominal pain only a slight episode of dysphagia since she was dilated last year reflux well-controlled on Protonix  Past Medical History:  Diagnosis Date   Anemia    Arthritis    Degenerative disc disease    Diabetes mellitus    Ehler's-Danlos syndrome    History of bronchitis    Mitral valve prolapse    echo 20 yr ago   PONV (postoperative nausea and vomiting)    Self-catheterizes urinary bladder    2 times a day as needed    Past Surgical History:  Procedure Laterality Date   ABDOMINAL HYSTERECTOMY     BIOPSY  06/17/2023   Procedure: BIOPSY;  Surgeon: Shaaron Lamar CHRISTELLA, MD;  [facility];  Service: Endoscopy;;   COLONOSCOPY N/A 10/23/2020   Procedure: COLONOSCOPY;  Surgeon: Shaaron Lamar CHRISTELLA, MD;  [facility];  Service: Endoscopy;  Laterality: N/A;  1:00   CYSTOCELE REPAIR  08/24/2012   Procedure: ANTERIOR REPAIR (CYSTOCELE);  Surgeon: Vonn VEAR Inch, MD;  [facility]: [facility];  Service: Gynecology;  Laterality: N/A;  ANTERIOR REPAIR WITH GRAFT   CYSTOSCOPY  08/24/2012   Procedure: CYSTOSCOPY;  Surgeon: Vonn VEAR Inch, MD;  [facility];  Service: Gynecology;  Laterality: N/A;   DENTAL RESTORATION/EXTRACTION WITH X-RAY     June 27   DIAGNOSTIC LAPAROSCOPY     DILATION AND CURETTAGE OF UTERUS     miscarriages   ESOPHAGOGASTRODUODENOSCOPY (EGD) WITH PROPOFOL  N/A 06/17/2023   Procedure: ESOPHAGOGASTRODUODENOSCOPY (EGD) WITH  PROPOFOL ;  Surgeon: Shaaron Lamar CHRISTELLA, MD;  [facility];  Service: Endoscopy;  Laterality: N/A;  7:30 am,asa 2   MALONEY DILATION N/A 06/17/2023   Procedure: AGAPITO DILATION;  Surgeon: Shaaron Lamar CHRISTELLA, MD;  [facility];  Service: Endoscopy;  Laterality: N/A;   OPEN REDUCTION INTERNAL FIXATION (ORIF) METACARPAL Left 02/01/2014   Procedure: LEFT RING FINGER FRACTURE OPEN TREATMENT METACARPAL SINGLE INCLUDES INTERNAL FIXATION EACH BONE;  Surgeon: Evalene JONETTA Chancy, MD;  Location: [facility];  Service: Orthopedics;  Laterality: Left;   PUBOVAGINAL SLING  08/24/2012   Procedure: CARLOYN GLADE;  Surgeon: Vonn VEAR Inch, MD;  [facility]: [facility];  Service: Gynecology;  Laterality: N/A;   TONSILLECTOMY     VAGINAL HYSTERECTOMY  08/24/2012   Procedure: HYSTERECTOMY VAGINAL;  Surgeon: Vonn VEAR Inch, MD;  [facility]: [facility];  Service: Gynecology;  Laterality: N/A;   VAGINAL PROLAPSE REPAIR  08/24/2012   Procedure: VAGINAL VAULT SUSPENSION;  Surgeon: Vonn VEAR Inch, MD;  Location: [facility];  Service: Gynecology;  Laterality: N/A;    Prior to Admission medications   Medication Sig Start Date End Date Taking? Authorizing Provider  acetaminophen  (TYLENOL ) 500 MG tablet Take 1,000 mg by mouth daily as needed for mild pain or moderate pain.    Yes [provider]  albuterol  (VENTOLIN  HFA) 108 (90 Base) MCG/ACT inhaler Inhale 1-2 puffs into the  lungs every 6 (six) hours as needed for wheezing or shortness of breath. 11/19/19  Yes [place], [place], PA-C  atorvastatin (LIPITOR) 40 MG tablet Take 40 mg by mouth at bedtime.   Yes [provider]  cholecalciferol (VITAMIN D) 1000 UNITS tablet Take 1,000 Units by mouth daily.   Yes [provider]  escitalopram (LEXAPRO) 10 MG tablet Take 10 mg by mouth daily. 05/05/23  Yes [provider]  FARXIGA 10 MG TABS tablet Take 10 mg by mouth daily. 12/26/23  Yes [provider]  fluticasone  (FLONASE )  50 MCG/ACT nasal spray Place 2 sprays into the nose daily as needed for allergies.   Yes [provider]  glimepiride  (AMARYL ) 1 MG tablet Take 1 mg by mouth 2 (two) times daily with a meal.   Yes [provider]  ketotifen (ALLERGY EYE DROPS) 0.025 % ophthalmic solution Place 1 drop into both eyes daily as needed (allergies).   Yes [provider]  levocetirizine (XYZAL) 5 MG tablet Take 5 mg by mouth at bedtime. 08/13/24  Yes [provider]  montelukast (SINGULAIR) 10 MG tablet Take 10 mg by mouth daily.    Yes [provider]  pantoprazole (PROTONIX) 40 MG tablet Take 40 mg by mouth daily. 07/22/23  Yes [provider]    Allergies as of 08/22/2024 - Review Complete 08/22/2024  Allergen Reaction Noted   Bee venom Swelling 06/17/2016   Latex Hives 08/09/2012   Penicillins Other (See Comments) 08/09/2012   Vancomycin Hives, Itching, and Nausea And Vomiting 08/09/2012   Doxycycline Swelling and Rash 08/09/2012    Family History  Problem Relation Age of Onset   Cancer Mother        colon    Hypertension Other    Diabetes Other    Stroke Other    Coronary artery disease Other     Social History   Socioeconomic History   Marital status: Married    Spouse name: Not on file   Number of children: Not on file   Years of education: Not on file   Highest education level: Not on file  Occupational History   Not on file  Tobacco Use   Smoking status: Never   Smokeless tobacco: Never  Vaping Use   Vaping status: Never Used  Substance and Sexual Activity   Alcohol use: Yes    Alcohol/week: 1.0 standard drink of alcohol    Types: 1 Glasses of wine per week    Comment: wine   Drug use: No   Sexual activity: Yes    Birth control/protection: Post-menopausal  Other Topics Concern   Not on file  Social History Narrative   Not on file   Social Drivers of Health   Financial Resource Strain: Not on file  Food Insecurity: Not on  file  Transportation Needs: Not on file  Physical Activity: Not on file  Stress: Not on file  Social Connections: Not on file  Intimate Partner Violence: Not on file    Review of Systems   See HPI, otherwise negative ROS  Physical Exam: BP 125/73 (BP Location: Right Arm, Patient Position: Sitting, Cuff Size: Normal)   Pulse 78   Temp 98.7 F (37.1 C) (Oral)   Ht 5' 7.5 (1.715 m)   Wt 146 lb 3.2 oz (66.3 kg)   SpO2 98%   BMI 22.56 kg/m  General:   Alert,  Well-developed, well-nourished, pleasant and cooperative in NAD Abdomen: Non-distended, normal bowel sounds.  Soft and  nontender without appreciable mass or hepatosplenomegaly.   Impression/Plan:   68 year old lady with Ehlers-Danlos syndrome GERD and pancreatic cyst.  History of colonic adenoma and family history colon cancer.  Cyst in her pancreas on CT scanning done for other reasons.  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  Typically run an indolent course with low risk of malignancy.  Agree with the radiologist recommendations of simply repeating an MRI in 2 years.  If they grow to a 3 cm diameter at some point then further evaluation would be warranted.  GERD well-controlled on once daily PPI.  Dysphagia has improved after normal-appearing esophagus was dilated conservatively last year.  Recommendations:  Repeat MRI in 2 years  Continue Protonix 40 mg daily  You will be due for a colonoscopy a year from now.  We will see you back in 1 year and set up a colonoscopy  If you have any interim problems please do not hesitate to reach out     Notice: This dictation was prepared with Dragon dictation along with smaller phrase technology. Any transcriptional errors that result from this process are unintentional and may not be corrected upon review.

## 2024-10-30 DIAGNOSIS — R5383 Other fatigue: Secondary | ICD-10-CM | POA: Diagnosis not present

## 2024-10-30 DIAGNOSIS — E1169 Type 2 diabetes mellitus with other specified complication: Secondary | ICD-10-CM | POA: Diagnosis not present

## 2024-10-30 DIAGNOSIS — E559 Vitamin D deficiency, unspecified: Secondary | ICD-10-CM | POA: Diagnosis not present

## 2024-11-02 ENCOUNTER — Other Ambulatory Visit (HOSPITAL_COMMUNITY): Payer: Self-pay | Admitting: Internal Medicine

## 2024-11-02 ENCOUNTER — Encounter: Payer: Self-pay | Admitting: Internal Medicine

## 2024-11-02 DIAGNOSIS — M8580 Other specified disorders of bone density and structure, unspecified site: Secondary | ICD-10-CM

## 2024-11-08 ENCOUNTER — Ambulatory Visit: Admitting: Internal Medicine

## 2024-11-17 ENCOUNTER — Ambulatory Visit: Payer: Self-pay | Admitting: Internal Medicine

## 2024-11-20 NOTE — Telephone Encounter (Signed)
 The patient has been notified of the result and verbalized understanding.  All questions (if any) were answered.  Patient stated that she wanted to hold off on increasing medication right now. She saw her PCP and was advised to hold off on the duck eggs and her levels will be checked again and will update us  on the lab work when that is done. Forwarded message to provider to make her aware Littie CHRISTELLA Croak, CMA 11/20/2024 3:31 PM

## 2024-11-20 NOTE — Telephone Encounter (Signed)
-----   Message from Vishnu Mallipeddi, MD sent at 11/17/2024  1:22 PM EST ----- LDL 172, elevated. TG 114,normal. On atorvastatin 40 mg at bedtime. Increase to 80 mg at bedtime and add zetia 10 mg once daily if patient is agreeable. S/e include GI issues with Zetia and muscle  aches with increased dose of atorvastatin. Goal LDL<100.
